# Patient Record
Sex: Male | Born: 1950 | Race: White | Hispanic: No | Marital: Married | Smoking: Never smoker
Health system: Southern US, Community
[De-identification: ages and names within clinical notes are randomized; demographics above are authoritative.]

## PROBLEM LIST (undated history)

## (undated) DIAGNOSIS — T63001A Toxic effect of unspecified snake venom, accidental (unintentional), initial encounter: Secondary | ICD-10-CM

## (undated) DIAGNOSIS — T7840XA Allergy, unspecified, initial encounter: Secondary | ICD-10-CM

## (undated) DIAGNOSIS — I10 Essential (primary) hypertension: Secondary | ICD-10-CM

## (undated) DIAGNOSIS — E785 Hyperlipidemia, unspecified: Secondary | ICD-10-CM

## (undated) DIAGNOSIS — K5792 Diverticulitis of intestine, part unspecified, without perforation or abscess without bleeding: Secondary | ICD-10-CM

## (undated) DIAGNOSIS — M1990 Unspecified osteoarthritis, unspecified site: Secondary | ICD-10-CM

## (undated) DIAGNOSIS — E079 Disorder of thyroid, unspecified: Secondary | ICD-10-CM

## (undated) DIAGNOSIS — Z800 Family history of malignant neoplasm of digestive organs: Secondary | ICD-10-CM

## (undated) HISTORY — DX: Hyperlipidemia, unspecified: E78.5

## (undated) HISTORY — DX: Family history of malignant neoplasm of digestive organs: Z80.0

## (undated) HISTORY — DX: Essential (primary) hypertension: I10

## (undated) HISTORY — DX: Unspecified osteoarthritis, unspecified site: M19.90

## (undated) HISTORY — DX: Allergy, unspecified, initial encounter: T78.40XA

## (undated) HISTORY — DX: Disorder of thyroid, unspecified: E07.9

## (undated) HISTORY — DX: Diverticulitis of intestine, part unspecified, without perforation or abscess without bleeding: K57.92

## (undated) HISTORY — DX: Toxic effect of unspecified snake venom, accidental (unintentional), initial encounter: T63.001A

---

## 1993-11-25 HISTORY — PX: APPENDECTOMY: SHX54

## 2004-05-22 ENCOUNTER — Ambulatory Visit (HOSPITAL_COMMUNITY): Admission: RE | Admit: 2004-05-22 | Discharge: 2004-05-22 | Payer: Self-pay | Admitting: Gastroenterology

## 2008-11-25 HISTORY — PX: CHOLECYSTECTOMY: SHX55

## 2009-08-13 ENCOUNTER — Inpatient Hospital Stay (HOSPITAL_COMMUNITY): Admission: EM | Admit: 2009-08-13 | Discharge: 2009-08-19 | Payer: Self-pay | Admitting: Emergency Medicine

## 2009-09-05 ENCOUNTER — Encounter: Payer: Self-pay | Admitting: Emergency Medicine

## 2009-09-05 ENCOUNTER — Ambulatory Visit: Payer: Self-pay | Admitting: Diagnostic Radiology

## 2009-09-05 ENCOUNTER — Inpatient Hospital Stay (HOSPITAL_COMMUNITY): Admission: EM | Admit: 2009-09-05 | Discharge: 2009-09-11 | Payer: Self-pay | Admitting: Internal Medicine

## 2009-09-09 ENCOUNTER — Encounter (INDEPENDENT_AMBULATORY_CARE_PROVIDER_SITE_OTHER): Payer: Self-pay | Admitting: General Surgery

## 2010-04-17 DIAGNOSIS — Z6830 Body mass index (BMI) 30.0-30.9, adult: Secondary | ICD-10-CM | POA: Insufficient documentation

## 2010-07-11 DIAGNOSIS — J309 Allergic rhinitis, unspecified: Secondary | ICD-10-CM | POA: Insufficient documentation

## 2011-01-28 DIAGNOSIS — I10 Essential (primary) hypertension: Secondary | ICD-10-CM | POA: Insufficient documentation

## 2011-01-28 DIAGNOSIS — M19049 Primary osteoarthritis, unspecified hand: Secondary | ICD-10-CM | POA: Insufficient documentation

## 2011-01-28 DIAGNOSIS — E039 Hypothyroidism, unspecified: Secondary | ICD-10-CM | POA: Insufficient documentation

## 2011-01-28 DIAGNOSIS — E782 Mixed hyperlipidemia: Secondary | ICD-10-CM | POA: Insufficient documentation

## 2011-02-28 LAB — DIFFERENTIAL
Basophils Absolute: 0 10*3/uL (ref 0.0–0.1)
Basophils Absolute: 0 10*3/uL (ref 0.0–0.1)
Basophils Absolute: 0 10*3/uL (ref 0.0–0.1)
Basophils Absolute: 0 10*3/uL (ref 0.0–0.1)
Basophils Relative: 0 % (ref 0–1)
Basophils Relative: 0 % (ref 0–1)
Basophils Relative: 0 % (ref 0–1)
Basophils Relative: 1 % (ref 0–1)
Eosinophils Absolute: 0 10*3/uL (ref 0.0–0.7)
Eosinophils Absolute: 0.2 10*3/uL (ref 0.0–0.7)
Eosinophils Absolute: 0.3 10*3/uL (ref 0.0–0.7)
Eosinophils Absolute: 0 10*3/uL (ref 0.0–0.7)
Eosinophils Absolute: 0 10*3/uL (ref 0.0–0.7)
Eosinophils Absolute: 0.1 10*3/uL (ref 0.0–0.7)
Eosinophils Relative: 0 % (ref 0–5)
Eosinophils Relative: 3 % (ref 0–5)
Eosinophils Relative: 0 % (ref 0–5)
Eosinophils Relative: 1 % (ref 0–5)
Lymphocytes Relative: 15 % (ref 12–46)
Lymphocytes Relative: 17 % (ref 12–46)
Lymphs Abs: 2 10*3/uL (ref 0.7–4.0)
Lymphs Abs: 1.5 10*3/uL (ref 0.7–4.0)
Lymphs Abs: 1.6 10*3/uL (ref 0.7–4.0)
Monocytes Absolute: 0.7 10*3/uL (ref 0.1–1.0)
Monocytes Absolute: 0.8 10*3/uL (ref 0.1–1.0)
Monocytes Absolute: 0.9 10*3/uL (ref 0.1–1.0)
Monocytes Absolute: 1.1 10*3/uL — ABNORMAL HIGH (ref 0.1–1.0)
Monocytes Relative: 6 % (ref 3–12)
Monocytes Relative: 7 % (ref 3–12)
Monocytes Relative: 7 % (ref 3–12)
Monocytes Relative: 8 % (ref 3–12)
Neutro Abs: 8.6 10*3/uL — ABNORMAL HIGH (ref 1.7–7.7)
Neutro Abs: 9.1 10*3/uL — ABNORMAL HIGH (ref 1.7–7.7)
Neutro Abs: 11 10*3/uL — ABNORMAL HIGH (ref 1.7–7.7)
Neutro Abs: 13.7 10*3/uL — ABNORMAL HIGH (ref 1.7–7.7)
Neutrophils Relative %: 75 % (ref 43–77)
Neutrophils Relative %: 76 % (ref 43–77)
Neutrophils Relative %: 80 % — ABNORMAL HIGH (ref 43–77)
Neutrophils Relative %: 80 % — ABNORMAL HIGH (ref 43–77)
Neutrophils Relative %: 81 % — ABNORMAL HIGH (ref 43–77)

## 2011-02-28 LAB — COMPREHENSIVE METABOLIC PANEL
ALT: 30 U/L (ref 0–53)
ALT: 16 U/L (ref 0–53)
ALT: 18 U/L (ref 0–53)
ALT: 24 U/L (ref 0–53)
AST: 15 U/L (ref 0–37)
AST: 26 U/L (ref 0–37)
AST: 15 U/L (ref 0–37)
AST: 22 U/L (ref 0–37)
AST: 44 U/L — ABNORMAL HIGH (ref 0–37)
Albumin: 2.6 g/dL — ABNORMAL LOW (ref 3.5–5.2)
Albumin: 3.1 g/dL — ABNORMAL LOW (ref 3.5–5.2)
Albumin: 4.5 g/dL (ref 3.5–5.2)
Alkaline Phosphatase: 58 U/L (ref 39–117)
Alkaline Phosphatase: 58 U/L (ref 39–117)
BUN: 9 mg/dL (ref 6–23)
BUN: 14 mg/dL (ref 6–23)
BUN: 18 mg/dL (ref 6–23)
CO2: 21 mEq/L (ref 19–32)
CO2: 26 mEq/L (ref 19–32)
CO2: 22 mEq/L (ref 19–32)
CO2: 24 mEq/L (ref 19–32)
Calcium: 8.1 mg/dL — ABNORMAL LOW (ref 8.4–10.5)
Calcium: 8.1 mg/dL — ABNORMAL LOW (ref 8.4–10.5)
Calcium: 8.2 mg/dL — ABNORMAL LOW (ref 8.4–10.5)
Calcium: 9.2 mg/dL (ref 8.4–10.5)
Chloride: 105 mEq/L (ref 96–112)
Chloride: 104 mEq/L (ref 96–112)
Creatinine, Ser: 0.78 mg/dL (ref 0.4–1.5)
Creatinine, Ser: 0.84 mg/dL (ref 0.4–1.5)
Creatinine, Ser: 1.2 mg/dL (ref 0.4–1.5)
GFR calc Af Amer: >60 mL/min (ref >60)
GFR calc Af Amer: >60 mL/min (ref >60)
GFR calc Af Amer: >60 mL/min (ref >60)
GFR calc Af Amer: >60 mL/min (ref >60)
GFR calc Af Amer: >60 mL/min (ref >60)
GFR calc non Af Amer: >60 mL/min (ref >60)
GFR calc non Af Amer: >60 mL/min (ref >60)
GFR calc non Af Amer: >60 mL/min (ref >60)
Glucose, Bld: 112 mg/dL — ABNORMAL HIGH (ref 70–99)
Glucose, Bld: 146 mg/dL — ABNORMAL HIGH (ref 70–99)
Glucose, Bld: 82 mg/dL (ref 70–99)
Glucose, Bld: 109 mg/dL — ABNORMAL HIGH (ref 70–99)
Glucose, Bld: 88 mg/dL (ref 70–99)
Potassium: 3.7 mEq/L (ref 3.5–5.1)
Potassium: 3.8 mEq/L (ref 3.5–5.1)
Potassium: 3.8 mEq/L (ref 3.5–5.1)
Potassium: 3.9 mEq/L (ref 3.5–5.1)
Sodium: 132 mEq/L — ABNORMAL LOW (ref 135–145)
Sodium: 136 mEq/L (ref 135–145)
Sodium: 131 mEq/L — ABNORMAL LOW (ref 135–145)
Total Bilirubin: 1.2 mg/dL (ref 0.3–1.2)
Total Bilirubin: 1.2 mg/dL (ref 0.3–1.2)
Total Bilirubin: 1.6 mg/dL — ABNORMAL HIGH (ref 0.3–1.2)
Total Protein: 5.9 g/dL — ABNORMAL LOW (ref 6.0–8.3)
Total Protein: 6 g/dL (ref 6.0–8.3)

## 2011-02-28 LAB — CBC
HCT: 37.8 % — ABNORMAL LOW (ref 39.0–52.0)
HCT: 33.9 % — ABNORMAL LOW (ref 39.0–52.0)
HCT: 40.3 % (ref 39.0–52.0)
Hemoglobin: 12.2 g/dL — ABNORMAL LOW (ref 13.0–17.0)
Hemoglobin: 12.8 g/dL — ABNORMAL LOW (ref 13.0–17.0)
Hemoglobin: 12.1 g/dL — ABNORMAL LOW (ref 13.0–17.0)
MCV: 88.4 fL (ref 78.0–100.0)
MCV: 88.7 fL (ref 78.0–100.0)
MCV: 87.9 fL (ref 78.0–100.0)
MCV: 89.2 fL (ref 78.0–100.0)
Platelets: 211 10*3/uL (ref 150–400)
Platelets: 187 10*3/uL (ref 150–400)
RBC: 4.08 MIL/uL — ABNORMAL LOW (ref 4.22–5.81)
RBC: 4.28 MIL/uL (ref 4.22–5.81)
RBC: 3.86 MIL/uL — ABNORMAL LOW (ref 4.22–5.81)
RBC: 4.03 MIL/uL — ABNORMAL LOW (ref 4.22–5.81)
RBC: 4.11 MIL/uL — ABNORMAL LOW (ref 4.22–5.81)
RDW: 12.2 % (ref 11.5–15.5)
RDW: 11.8 % (ref 11.5–15.5)
RDW: 12 % (ref 11.5–15.5)
WBC: 12.1 10*3/uL — ABNORMAL HIGH (ref 4.0–10.5)
WBC: 13.6 10*3/uL — ABNORMAL HIGH (ref 4.0–10.5)

## 2011-02-28 LAB — MAGNESIUM: Magnesium: 2.1 mg/dL (ref 1.5–2.5)

## 2011-02-28 LAB — POCT CARDIAC MARKERS
Myoglobin, poc: 63.8 ng/mL (ref 12–200)
Troponin i, poc: <0.05 ng/mL (ref 0.00–0.09)

## 2011-02-28 LAB — LIPID PANEL
Total CHOL/HDL Ratio: 3.2 RATIO
Triglycerides: 90 mg/dL (ref <150)

## 2011-02-28 LAB — URINALYSIS, ROUTINE W REFLEX MICROSCOPIC
Glucose, UA: NEGATIVE mg/dL
Hgb urine dipstick: NEGATIVE
Protein, ur: NEGATIVE mg/dL
Specific Gravity, Urine: 1.014 (ref 1.005–1.030)
Urobilinogen, UA: 1 mg/dL (ref 0.0–1.0)

## 2011-02-28 LAB — HEPATIC FUNCTION PANEL
ALT: 17 U/L (ref 0–53)
Alkaline Phosphatase: 56 U/L (ref 39–117)
Bilirubin, Direct: 0.2 mg/dL (ref 0.0–0.3)
Indirect Bilirubin: 0.7 mg/dL (ref 0.3–0.9)
Total Bilirubin: 0.9 mg/dL (ref 0.3–1.2)
Total Protein: 6 g/dL (ref 6.0–8.3)

## 2011-02-28 LAB — CULTURE, BLOOD (ROUTINE X 2)
Culture: NO GROWTH
Culture: NO GROWTH

## 2011-02-28 LAB — VANCOMYCIN, TROUGH: Vancomycin Tr: 11.2 ug/mL (ref 10.0–20.0)

## 2011-03-01 LAB — DIC (DISSEMINATED INTRAVASCULAR COAGULATION)PANEL
D-Dimer, Quant: 0.25 ug/mL-FEU (ref 0.00–0.48)
Fibrinogen: 293 mg/dL (ref 204–475)
Platelets: 184 10*3/uL (ref 150–400)
aPTT: 30 seconds (ref 24–37)

## 2011-03-01 LAB — COMPREHENSIVE METABOLIC PANEL
ALT: 20 U/L (ref 0–53)
AST: 26 U/L (ref 0–37)
Albumin: 3.7 g/dL (ref 3.5–5.2)
Calcium: 8.3 mg/dL — ABNORMAL LOW (ref 8.4–10.5)
GFR calc Af Amer: >60 mL/min (ref >60)
Glucose, Bld: 179 mg/dL — ABNORMAL HIGH (ref 70–99)
Potassium: 3.5 mEq/L (ref 3.5–5.1)
Sodium: 139 mEq/L (ref 135–145)
Total Protein: 6.5 g/dL (ref 6.0–8.3)

## 2011-03-01 LAB — CBC
HCT: 42.5 % (ref 39.0–52.0)
HCT: 43.7 % (ref 39.0–52.0)
HCT: 44 % (ref 39.0–52.0)
Hemoglobin: 14.1 g/dL (ref 13.0–17.0)
Hemoglobin: 14.4 g/dL (ref 13.0–17.0)
MCHC: 33.4 g/dL (ref 30.0–36.0)
MCHC: 33.9 g/dL (ref 30.0–36.0)
MCV: 88.1 fL (ref 78.0–100.0)
Platelets: 191 10*3/uL (ref 150–400)
Platelets: 194 10*3/uL (ref 150–400)
Platelets: 218 10*3/uL (ref 150–400)
RBC: 4.56 MIL/uL (ref 4.22–5.81)
RBC: 4.65 MIL/uL (ref 4.22–5.81)
RBC: 5.18 MIL/uL (ref 4.22–5.81)
RDW: 12.1 % (ref 11.5–15.5)
RDW: 12.4 % (ref 11.5–15.5)
RDW: 12.8 % (ref 11.5–15.5)
WBC: 12.4 10*3/uL — ABNORMAL HIGH (ref 4.0–10.5)
WBC: 8.5 10*3/uL (ref 4.0–10.5)

## 2011-03-01 LAB — DIFFERENTIAL
Lymphocytes Relative: 41 % (ref 12–46)
Lymphs Abs: 5.1 10*3/uL — ABNORMAL HIGH (ref 0.7–4.0)
Monocytes Relative: 5 % (ref 3–12)
Neutro Abs: 6.4 10*3/uL (ref 1.7–7.7)
Neutrophils Relative %: 51 % (ref 43–77)

## 2011-03-01 LAB — BASIC METABOLIC PANEL
BUN: 10 mg/dL (ref 6–23)
BUN: 12 mg/dL (ref 6–23)
CO2: 24 mEq/L (ref 19–32)
CO2: 26 mEq/L (ref 19–32)
Calcium: 8.3 mg/dL — ABNORMAL LOW (ref 8.4–10.5)
Calcium: 8.7 mg/dL (ref 8.4–10.5)
Calcium: 8.7 mg/dL (ref 8.4–10.5)
Chloride: 104 mEq/L (ref 96–112)
Creatinine, Ser: 0.85 mg/dL (ref 0.4–1.5)
Creatinine, Ser: 0.87 mg/dL (ref 0.4–1.5)
GFR calc Af Amer: >60 mL/min (ref >60)
GFR calc Af Amer: >60 mL/min (ref >60)
GFR calc non Af Amer: >60 mL/min (ref >60)
GFR calc non Af Amer: >60 mL/min (ref >60)
GFR calc non Af Amer: >60 mL/min (ref >60)
GFR calc non Af Amer: >60 mL/min (ref >60)
Glucose, Bld: 112 mg/dL — ABNORMAL HIGH (ref 70–99)
Glucose, Bld: 113 mg/dL — ABNORMAL HIGH (ref 70–99)
Glucose, Bld: 113 mg/dL — ABNORMAL HIGH (ref 70–99)
Potassium: 4.2 mEq/L (ref 3.5–5.1)
Potassium: 4.3 mEq/L (ref 3.5–5.1)
Potassium: 4.4 mEq/L (ref 3.5–5.1)
Sodium: 137 mEq/L (ref 135–145)
Sodium: 138 mEq/L (ref 135–145)

## 2011-03-01 LAB — PROTIME-INR
INR: 0.9 (ref 0.00–1.49)
Prothrombin Time: 12.3 seconds (ref 11.6–15.2)

## 2011-03-01 LAB — FIBRINOGEN: Fibrinogen: 304 mg/dL (ref 204–475)

## 2011-04-12 NOTE — Op Note (Signed)
NAMESOLOMAN, MCKEITHAN                           ACCOUNT NO.:  [id number]   MEDICAL RECORD NO.:  [id number]                   PATIENT TYPE:  AMB   LOCATION:  ENDO                                 FACILITY:  [facility]   PHYSICIAN:  Graylin Shiver, M.D.                DATE OF BIRTH:  1951/01/03   DATE OF PROCEDURE:  05/22/2004  DATE OF DISCHARGE:                                 OPERATIVE REPORT   PROCEDURE:  Colonoscopy.   INDICATION:  Screening.   Informed consent was obtained after explanation of the risks of bleeding,  infection, or perforation.   PREMEDICATIONS:  Fentanyl 100 mcg IV, Versed 10 mg IV.   DESCRIPTION OF PROCEDURE:  With the patient in the left lateral decubitus  position a rectal exam was performed and no masses were felt.  The Olympus  colonoscope was inserted into the rectum and advanced around the colon to  the cecum.  Cecal landmarks were identified.  The cecum and ascending colon  were normal.  The transverse colon normal.  The descending colon and sigmoid  showed a few scattered diverticula.  The rectum was normal.  He tolerated  the procedure well without complications.   IMPRESSION:  Minimal diverticulosis of the left colon, otherwise normal  colonoscopy to the cecum.   PLAN:  I would recommend a follow-up colonoscopy again in 10 years.                                               Graylin Shiver, M.D.    Germain Osgood  D:  05/22/2004  T:  05/22/2004  Job:  [id number]   cc:   Duwayne Heck L. Mahaffey, M.D.  [address]  [place]  [place] [address]  Fax: [phone]

## 2011-05-07 DIAGNOSIS — R7301 Impaired fasting glucose: Secondary | ICD-10-CM | POA: Insufficient documentation

## 2012-05-30 ENCOUNTER — Emergency Department (HOSPITAL_COMMUNITY)
Admission: EM | Admit: 2012-05-30 | Discharge: 2012-05-30 | Disposition: A | Discharge status: Home / Self Care | Payer: 59 | Attending: Emergency Medicine | Admitting: Emergency Medicine

## 2012-05-30 ENCOUNTER — Emergency Department (HOSPITAL_COMMUNITY): Payer: 59

## 2012-05-30 DIAGNOSIS — N201 Calculus of ureter: Secondary | ICD-10-CM | POA: Insufficient documentation

## 2012-05-30 DIAGNOSIS — N23 Unspecified renal colic: Secondary | ICD-10-CM

## 2012-05-30 DIAGNOSIS — R109 Unspecified abdominal pain: Secondary | ICD-10-CM | POA: Insufficient documentation

## 2012-05-30 DIAGNOSIS — N1330 Unspecified hydronephrosis: Secondary | ICD-10-CM | POA: Insufficient documentation

## 2012-05-30 LAB — URINALYSIS, ROUTINE W REFLEX MICROSCOPIC
Bilirubin Urine: NEGATIVE
Glucose, UA: NEGATIVE mg/dL
Leukocytes, UA: NEGATIVE
Nitrite: NEGATIVE
Protein, ur: NEGATIVE mg/dL
Specific Gravity, Urine: 1.031 — ABNORMAL HIGH (ref 1.005–1.030)
Urobilinogen, UA: 1 mg/dL (ref 0.0–1.0)
pH: 5.5 (ref 5.0–8.0)

## 2012-05-30 LAB — URINE MICROSCOPIC-ADD ON

## 2012-05-30 MED ORDER — HYDROMORPHONE HCL PF 1 MG/ML IJ SOLN
1.0000 mg | Freq: Once | INTRAMUSCULAR | Status: AC
  Administered 2012-05-30: 1 mg via INTRAVENOUS
  Filled 2012-05-30: qty 1

## 2012-05-30 MED ORDER — TAMSULOSIN HCL 0.4 MG PO CAPS: 0.4000 mg | ORAL_CAPSULE | Freq: Every day | ORAL | Status: DC

## 2012-05-30 MED ORDER — KETOROLAC TROMETHAMINE 30 MG/ML IJ SOLN
INTRAMUSCULAR | Status: AC
  Administered 2012-05-30: 30 mg
  Filled 2012-05-30: qty 1

## 2012-05-30 MED ORDER — ONDANSETRON 8 MG PO TBDP: 8.0000 mg | ORAL_TABLET | Freq: Three times a day (TID) | ORAL | Status: AC | PRN

## 2012-05-30 MED ORDER — OXYCODONE-ACETAMINOPHEN 7.5-325 MG PO TABS: 1.0000 | ORAL_TABLET | ORAL | Status: AC | PRN

## 2012-05-30 MED ORDER — ONDANSETRON 8 MG PO TBDP
ORAL_TABLET | ORAL | Status: AC
  Administered 2012-05-30: 13:00:00
  Filled 2012-05-30: qty 1

## 2012-05-30 MED ORDER — HYDROMORPHONE HCL PF 1 MG/ML IJ SOLN
INTRAMUSCULAR | Status: AC
  Administered 2012-05-30: 14:00:00
  Filled 2012-05-30: qty 1

## 2012-05-30 NOTE — ED Notes (Signed)
Pt reports having left flank pain starting around 30 minutes prior to coming to the ER. Pt actively nauseated and vomiting upon arrival. Pt alert and oriented x 4. Wife at the bedside.

## 2012-05-30 NOTE — ED Notes (Signed)
WUJ:[id number]<JY> Expected date:05/30/12<BR> Expected time:12:39 PM<BR> Means of arrival:Car<BR> Comments:<BR> ? Kidney Stone

## 2012-05-30 NOTE — ED Provider Notes (Signed)
History     CSN: [id number]  Arrival date & time 05/30/12  1254   First MD Initiated Contact with Patient 05/30/12 1302      Chief Complaint  Patient presents with  . Flank Pain    (Consider location/radiation/quality/duration/timing/severity/associated sxs/prior treatment) Patient is a 61 y.o. male presenting with flank pain. The history is provided by the patient.  Flank Pain   patient here with left-sided flank pain x23 days worse in the last 30 minutes. Pain has been colicky. No hematuria or dysuria. No fever no prior history of kidney stones. No medications taken prior to arrival. Some associated nausea and vomiting. Denies any testicular pain.  No past medical history on file.  No past surgical history on file.  No family history on file.  History  Substance Use Topics  . Smoking status: Not on file  . Smokeless tobacco: Not on file  . Alcohol Use: Not on file      Review of Systems  Genitourinary: Positive for flank pain.  All other systems reviewed and are negative.    Allergies  Review of patient's allergies indicates not on file.  Home Medications  No current outpatient prescriptions on file.  BP 166/84  Pulse 67  Temp 97.8 F (36.6 C) (Oral)  Resp 19  SpO2 99%  Physical Exam  Nursing note and vitals reviewed. Constitutional: He is oriented to person, place, and time. He appears well-developed and well-nourished.  Non-toxic appearance. No distress.  HENT:  Head: Normocephalic and atraumatic.  Eyes: Conjunctivae, EOM and lids are normal. Pupils are equal, round, and reactive to light.  Neck: Normal range of motion. Neck supple. No tracheal deviation present. No mass present.  Cardiovascular: Normal rate, regular rhythm and normal heart sounds.  Exam reveals no gallop.   No murmur heard. Pulmonary/Chest: Effort normal and breath sounds normal. No stridor. No respiratory distress. He has no decreased breath sounds. He has no wheezes. He has no  rhonchi. He has no rales.  Abdominal: Soft. Normal appearance and bowel sounds are normal. He exhibits no distension. There is no tenderness. There is no rebound and no CVA tenderness.  Musculoskeletal: Normal range of motion. He exhibits no edema and no tenderness.  Neurological: He is alert and oriented to person, place, and time. He has normal strength. No cranial nerve deficit or sensory deficit. GCS eye subscore is 4. GCS verbal subscore is 5. GCS motor subscore is 6.  Skin: Skin is warm and dry. No abrasion and no rash noted.  Psychiatric: He has a normal mood and affect. His speech is normal and behavior is normal.    ED Course  Procedures (including critical care time)   Labs Reviewed  URINALYSIS, ROUTINE W REFLEX MICROSCOPIC  URINE CULTURE   No results found.   No diagnosis found.    MDM  Patient given pain meds here for his kidney stone and he does feel better. Patient instructed to have an elective CAT scan of his chest for evaluation of the pleural thickening and he'll be given a referral to urology on call        Toy Baker, MD 05/30/12 1438

## 2012-05-31 LAB — URINE CULTURE
Colony Count: NO GROWTH
Culture: NO GROWTH

## 2012-06-11 ENCOUNTER — Other Ambulatory Visit: Payer: Self-pay | Admitting: Family Medicine

## 2012-06-11 ENCOUNTER — Ambulatory Visit
Admission: RE | Admit: 2012-06-11 | Discharge: 2012-06-11 | Disposition: A | Discharge status: Home / Self Care | Payer: 59 | Source: Ambulatory Visit | Attending: Family Medicine | Admitting: Family Medicine

## 2012-06-11 DIAGNOSIS — R9389 Abnormal findings on diagnostic imaging of other specified body structures: Secondary | ICD-10-CM

## 2013-03-01 ENCOUNTER — Emergency Department (HOSPITAL_COMMUNITY)
Admission: EM | Admit: 2013-03-01 | Discharge: 2013-03-01 | Disposition: A | Discharge status: Home / Self Care | Payer: 59 | Attending: Emergency Medicine | Admitting: Emergency Medicine

## 2013-03-01 DIAGNOSIS — Z79899 Other long term (current) drug therapy: Secondary | ICD-10-CM | POA: Insufficient documentation

## 2013-03-01 DIAGNOSIS — I10 Essential (primary) hypertension: Secondary | ICD-10-CM | POA: Insufficient documentation

## 2013-03-01 DIAGNOSIS — R209 Unspecified disturbances of skin sensation: Secondary | ICD-10-CM | POA: Insufficient documentation

## 2013-03-01 NOTE — ED Notes (Signed)
Pt states SOB of since changing his medication on March 24, and starting today numbness in left arm and lips.

## 2013-03-01 NOTE — ED Provider Notes (Signed)
History     CSN: [id number]  Arrival date & time 03/01/13  0344   First MD Initiated Contact with Patient 03/01/13 [phone]      Chief Complaint  Patient presents with  . Shortness of Breath    (Consider location/radiation/quality/duration/timing/severity/associated sxs/prior treatment) HPI Comments: Rodney Pineda is a 62 y.o. Male who was driving his truck, tonight, when he felt a tingling sensation in his left hand. He checked his blood pressure, while he was driving and again, after he gets that, and it was elevated both times. He did not have headache or chest pain. His doctor added hydrochlorothiazide to his lisinopril, 2 weeks ago because his blood pressure was over 140. Since that time. He has been feeling weak, being more tired than usual. His wife, who is a Engineer, civil (consulting), has been checking his blood pressure, and it has been below 120 systolic. He did not take his antihypertensive medication on 02/27/13 or,  02/28/2013 He has been eating well. Since arriving in the emergency room he feels better. There is less tingling in his left hand. He has not developed any other symptoms. There are no known modifying factors.  Patient is a 62 y.o. male presenting with shortness of breath. The history is provided by the patient.  Shortness of Breath   No past medical history on file.  No past surgical history on file.  No family history on file.  History  Substance Use Topics  . Smoking status: Not on file  . Smokeless tobacco: Not on file  . Alcohol Use: Not on file      Review of Systems  Respiratory: Positive for shortness of breath.   All other systems reviewed and are negative.    Allergies  Review of patient's allergies indicates no known allergies.  Home Medications   Current Outpatient Rx  Name  Route  Sig  Dispense  Refill  . fish oil-omega-3 fatty acids 1000 MG capsule   Oral   Take 1 g by mouth 2 (two) times daily.         . Flaxseed, Linseed, 1000 MG CAPS   Oral  Take 1,000 mg by mouth 2 (two) times daily.         Marland Kitchen levothyroxine (SYNTHROID, LEVOTHROID) 137 MCG tablet   Oral   Take 137 mcg by mouth daily.         Marland Kitchen lisinopril-hydrochlorothiazide (PRINZIDE,ZESTORETIC) 20-12.5 MG per tablet   Oral   Take 1 tablet by mouth daily.         . Multiple Vitamin (MULTIVITAMIN WITH MINERALS) TABS   Oral   Take 1 tablet by mouth daily.         . simvastatin (ZOCOR) 40 MG tablet   Oral   Take 20 mg by mouth every evening. Pt takes 0.5 of 40 mg tablet           BP 149/76  Pulse 80  Temp(Src) 98.5 F (36.9 C) (Oral)  Resp 16  SpO2 100%  Physical Exam  Nursing note and vitals reviewed. Constitutional: He is oriented to person, place, and time. He appears well-developed and well-nourished.  HENT:  Head: Normocephalic and atraumatic.  Right Ear: External ear normal.  Left Ear: External ear normal.  Eyes: Conjunctivae and EOM are normal. Pupils are equal, round, and reactive to light.  Neck: Normal range of motion and phonation normal. Neck supple.  Cardiovascular: Normal rate, regular rhythm, normal heart sounds and intact distal pulses.   Pulmonary/Chest: Effort normal and  breath sounds normal. He exhibits no bony tenderness.  Abdominal: Soft. Normal appearance. There is no tenderness.  Musculoskeletal: Normal range of motion.  Neurological: He is alert and oriented to person, place, and time. He has normal strength. No cranial nerve deficit or sensory deficit. He exhibits normal muscle tone. Coordination normal.  Romberg is negative. There is no dysesthesia of the extremities.  Skin: Skin is warm, dry and intact.  Psychiatric: He has a normal mood and affect. His behavior is normal. Judgment and thought content normal.    ED Course  Procedures (including critical care time)     Date: 09/11/2012  Rate: 86  Rhythm: normal sinus rhythm  QRS Axis: normal  PR and QT Intervals: normal  ST/T Wave abnormalities: normal  PR and QRS  Conduction Disutrbances:none  Narrative Interpretation:   Old EKG Reviewed: none available   Nursing Notes Reviewed/ Care Coordinated, and agree without changes. Applicable Imaging Reviewed.  Interpretation of Laboratory Data incorporated into ED treatment     1. Hypertension       MDM  Episode of hypertension, measured by patient. No recurrence of hypertension in the emergency department. Symptoms improved spontaneously. I doubt hypertensive urgency, ACS, metabolic instability. He is stable for discharge      Plan: Home Medications- he will contact his PCP, before resuming his prescribed medication; Home Treatments- rest; Recommended follow up- PCP for discussion of appropriate blood pressure medications     Flint Melter, MD 03/01/13 [phone]

## 2014-01-31 DIAGNOSIS — Z800 Family history of malignant neoplasm of digestive organs: Secondary | ICD-10-CM | POA: Insufficient documentation

## 2015-05-01 DIAGNOSIS — K635 Polyp of colon: Secondary | ICD-10-CM | POA: Insufficient documentation

## 2016-03-14 LAB — HM HEPATITIS C SCREENING LAB: HM Hepatitis Screen: NEGATIVE

## 2017-03-12 DIAGNOSIS — C44619 Basal cell carcinoma of skin of left upper limb, including shoulder: Secondary | ICD-10-CM | POA: Insufficient documentation

## 2017-10-31 ENCOUNTER — Other Ambulatory Visit: Payer: Self-pay | Admitting: *Deleted

## 2017-10-31 ENCOUNTER — Encounter: Payer: Self-pay | Admitting: *Deleted

## 2017-11-03 ENCOUNTER — Ambulatory Visit: Payer: Self-pay | Admitting: Family Medicine

## 2017-11-28 ENCOUNTER — Ambulatory Visit (INDEPENDENT_AMBULATORY_CARE_PROVIDER_SITE_OTHER): Payer: PRIVATE HEALTH INSURANCE | Admitting: Family Medicine

## 2017-11-28 ENCOUNTER — Encounter: Payer: Self-pay | Admitting: Family Medicine

## 2017-11-28 VITALS — BP 140/82 | HR 64 | Temp 98.3°F | Ht 71.0 in | Wt 238.2 lb

## 2017-11-28 DIAGNOSIS — E782 Mixed hyperlipidemia: Secondary | ICD-10-CM | POA: Diagnosis not present

## 2017-11-28 DIAGNOSIS — E039 Hypothyroidism, unspecified: Secondary | ICD-10-CM | POA: Diagnosis not present

## 2017-11-28 DIAGNOSIS — I10 Essential (primary) hypertension: Secondary | ICD-10-CM

## 2017-11-28 MED ORDER — SIMVASTATIN 40 MG PO TABS: 40.0000 mg | ORAL_TABLET | Freq: Every evening | ORAL | 3 refills | Status: DC

## 2017-11-28 MED ORDER — METOPROLOL SUCCINATE ER 100 MG PO TB24: 100.0000 mg | ORAL_TABLET | Freq: Every day | ORAL | 3 refills | Status: DC

## 2017-11-28 MED ORDER — LISINOPRIL 40 MG PO TABS: 40.0000 mg | ORAL_TABLET | Freq: Every day | ORAL | 3 refills | Status: DC

## 2017-11-28 MED ORDER — LEVOTHYROXINE SODIUM 137 MCG PO TABS: 137.0000 ug | ORAL_TABLET | Freq: Every day | ORAL | 3 refills | Status: DC

## 2017-11-28 MED ORDER — AMLODIPINE BESYLATE 5 MG PO TABS: 5.0000 mg | ORAL_TABLET | Freq: Every day | ORAL | 3 refills | Status: DC

## 2017-11-28 NOTE — Patient Instructions (Signed)
It was so good seeing you again! Thank you for establishing with my new practice and allowing me to continue caring for you. It means a lot to me.   Please schedule a follow up appointment with me in 3-4 months for your complete physical, come fasting.  Try to follow a low sugar, low fat diet.

## 2017-11-28 NOTE — Progress Notes (Signed)
Subjective  CC:  Chief Complaint  Patient presents with  . Establish Care    Transfer from [place]  . Hypertension  . Hyperlipidemia  . Hypothyroidism    HPI: Rodney Pineda is a 67 y.o. male who presents to the office today to address the problems listed above in the chief complaint. He is a former [facility] patient and is here to reestablish care with me today.  Last cpe 03/12/2017. I reviewed stable labs from care everywhere.  Hypertension with white coat component f/u: Control is good . Pt reports he is doing well. taking medications as instructed, no medication side effects noted, home BP monitoring in range of 195-093O systolic over 67-12W diastolic, no TIAs, no chest pain on exertion, no dyspnea on exertion, no swelling of ankles. He denies adverse effects from his BP medications. Compliance with medication is good.  Thyroid f/u: Patient presents for evaluation of hypothyroidism. Current symptoms include denies fatigue, weight changes, heat/cold intolerance, bowel/skin changes or CVS symptoms.compliance with medications is good.  BP Readings from Last 3 Encounters:  11/28/17 140/82  03/01/13 149/76  05/30/12 145/98   Wt Readings from Last 3 Encounters:  11/28/17 238 lb 4 oz (108.1 kg)   I reviewed the patients updated PMH, FH, and SocHx.    Patient Active Problem List   Diagnosis Date Noted  . Basal cell carcinoma (BCC) of skin of left upper extremity including shoulder 03/12/2017  . Colon polyps 05/01/2015  . Family history of colon cancer 01/31/2014  . IFG (impaired fasting glucose) 05/07/2011  . Acquired hypothyroidism 01/28/2011  . Benign essential hypertension 01/28/2011  . Mixed hyperlipidemia 01/28/2011  . Osteoarthritis, 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  . Allergic rhinitis 07/11/2010  . BMI 30.0-30.9,adult 04/17/2010    Allergies: Hydrochlorothiazide and Niacin and related  Social History: Patient  reports that  has never smoked. he has never used smokeless tobacco. He reports  that he does not drink alcohol or use drugs.  Current Meds  Medication Sig  . amLODipine (NORVASC) 5 MG tablet Take 1 tablet (5 mg total) by mouth daily.  Marland Kitchen CINNAMON PO Take 1 tablet by mouth daily.   . diclofenac sodium (VOLTAREN) 1 % GEL Place 2 g onto the skin as needed.   Marland Kitchen esomeprazole (NEXIUM) 20 MG capsule Take 20 mg by mouth as needed.   . fish oil-omega-3 fatty acids 1000 MG capsule Take 1 g by mouth 2 (two) times daily.  . Flaxseed, Linseed, 1000 MG CAPS Take 1,000 mg by mouth 2 (two) times daily.  . furosemide (LASIX) 20 MG tablet Take 20 mg by mouth as needed.   Marland Kitchen levothyroxine (SYNTHROID, LEVOTHROID) 137 MCG tablet Take 1 tablet (137 mcg total) by mouth daily.  Marland Kitchen lisinopril (PRINIVIL,ZESTRIL) 40 MG tablet Take 1 tablet (40 mg total) by mouth daily.  . metoprolol succinate (TOPROL-XL) 100 MG 24 hr tablet Take 1 tablet (100 mg total) by mouth daily.  . Multiple Vitamin (MULTIVITAMIN WITH MINERALS) TABS Take 1 tablet by mouth daily.  . simvastatin (ZOCOR) 40 MG tablet Take 1 tablet (40 mg total) by mouth every evening.  . [DISCONTINUED] amLODipine (NORVASC) 10 MG tablet Take 5 mg by mouth daily.   . [DISCONTINUED] levothyroxine (SYNTHROID, LEVOTHROID) 137 MCG tablet Take 137 mcg by mouth daily.  . [DISCONTINUED] lisinopril (PRINIVIL,ZESTRIL) 40 MG tablet Take 40 mg by mouth daily.   . [DISCONTINUED] metoprolol succinate (TOPROL-XL) 100 MG 24 hr tablet Take 100 mg by mouth daily.   . [DISCONTINUED] simvastatin (ZOCOR) 40  MG tablet Take 40 mg by mouth every evening. Pt takes 0.5 of 40 mg tablet     Review of Systems: Cardiovascular: negative for chest pain, palpitations, leg swelling, orthopnea Respiratory: negative for SOB, wheezing or persistent cough Gastrointestinal: negative for abdominal pain Genitourinary: negative for dysuria or gross hematuria  Objective  Vitals: BP 140/82 (BP Location: Left Arm, Patient Position: Sitting, Cuff Size: Large)   Pulse 64   Temp 98.3 F  (36.8 C) (Oral)   Ht 5\' 11"  (1.803 m)   Wt 238 lb 4 oz (108.1 kg)   SpO2 97%   BMI 33.23 kg/m  General: no acute distress  Psych:  Alert and oriented, normal mood and affect HEENT:  Normocephalic, atraumatic, supple neck  Cardiovascular:  RRR with systolic murmur. no edema Respiratory:  Good breath sounds bilaterally, CTAB with normal respiratory effort Skin:  Warm, no rashes Neurologic:   Mental status is normal. No tremor  Assessment  1. Benign essential hypertension   2. Acquired hypothyroidism   3. Mixed hyperlipidemia      Plan    Hypertension f/u: BP control is fairly well controlled. Continue same meds and work on weight loss. Continue home readings due to white coat component. Due for labs in april  Hyperlipidemia f/u: stable. Refilled statin  Thyroid controlled well clinically. Refilled meds  Education regarding management of these chronic disease states was given. Management strategies discussed on successive visits include dietary and exercise recommendations, goals of achieving and maintaining IBW, and lifestyle modifications aiming for adequate sleep and minimizing stressors.   Follow up: No Follow-up on file.   Commons side effects, risks, benefits, and alternatives for medications and treatment plan prescribed today were discussed, and the patient expressed understanding of the given instructions. Patient is instructed to call or message via MyChart if he/she has any questions or concerns regarding our treatment plan. No barriers to understanding were identified. We discussed Red Flag symptoms and signs in detail. Patient expressed understanding regarding what to do in case of urgent or emergency type symptoms.   Medication list was reconciled, printed and provided to the patient in AVS. Patient instructions and summary information was reviewed with the patient as documented in the AVS. This note was prepared with assistance of Dragon voice recognition software.  Occasional wrong-word or sound-a-like substitutions may have occurred due to the inherent limitations of voice recognition software  No orders of the defined types were placed in this encounter.  Meds ordered this encounter  Medications  . amLODipine (NORVASC) 5 MG tablet    Sig: Take 1 tablet (5 mg total) by mouth daily.    Dispense:  90 tablet    Refill:  3  . levothyroxine (SYNTHROID, LEVOTHROID) 137 MCG tablet    Sig: Take 1 tablet (137 mcg total) by mouth daily.    Dispense:  90 tablet    Refill:  3  . lisinopril (PRINIVIL,ZESTRIL) 40 MG tablet    Sig: Take 1 tablet (40 mg total) by mouth daily.    Dispense:  90 tablet    Refill:  3  . metoprolol succinate (TOPROL-XL) 100 MG 24 hr tablet    Sig: Take 1 tablet (100 mg total) by mouth daily.    Dispense:  90 tablet    Refill:  3  . simvastatin (ZOCOR) 40 MG tablet    Sig: Take 1 tablet (40 mg total) by mouth every evening.    Dispense:  90 tablet    Refill:  3

## 2018-02-12 ENCOUNTER — Ambulatory Visit (INDEPENDENT_AMBULATORY_CARE_PROVIDER_SITE_OTHER): Payer: PRIVATE HEALTH INSURANCE

## 2018-02-12 ENCOUNTER — Other Ambulatory Visit: Payer: Self-pay

## 2018-02-12 ENCOUNTER — Encounter: Payer: Self-pay | Admitting: Family Medicine

## 2018-02-12 ENCOUNTER — Ambulatory Visit: Payer: PRIVATE HEALTH INSURANCE | Admitting: Family Medicine

## 2018-02-12 VITALS — BP 142/92 | HR 64 | Temp 98.6°F | Resp 17 | Ht 71.0 in | Wt 239.6 lb

## 2018-02-12 DIAGNOSIS — R3915 Urgency of urination: Secondary | ICD-10-CM

## 2018-02-12 DIAGNOSIS — R1031 Right lower quadrant pain: Secondary | ICD-10-CM | POA: Diagnosis not present

## 2018-02-12 LAB — CBC WITH DIFFERENTIAL/PLATELET
BASOS PCT: 0.6 % (ref 0.0–3.0)
Basophils Absolute: 0 10*3/uL (ref 0.0–0.1)
EOS ABS: 0.1 10*3/uL (ref 0.0–0.7)
Eosinophils Relative: 1.7 % (ref 0.0–5.0)
HEMATOCRIT: 44.8 % (ref 39.0–52.0)
Hemoglobin: 15.1 g/dL (ref 13.0–17.0)
LYMPHS PCT: 39.7 % (ref 12.0–46.0)
Lymphs Abs: 2.7 10*3/uL (ref 0.7–4.0)
MCHC: 33.7 g/dL (ref 30.0–36.0)
MCV: 86.9 fl (ref 78.0–100.0)
Monocytes Absolute: 0.6 10*3/uL (ref 0.1–1.0)
Monocytes Relative: 8.2 % (ref 3.0–12.0)
NEUTROS ABS: 3.4 10*3/uL (ref 1.4–7.7)
Neutrophils Relative %: 49.8 % (ref 43.0–77.0)
PLATELETS: 204 10*3/uL (ref 150.0–400.0)
RBC: 5.15 Mil/uL (ref 4.22–5.81)
RDW: 12.7 % (ref 11.5–15.5)
WBC: 6.9 10*3/uL (ref 4.0–10.5)

## 2018-02-12 LAB — POCT URINALYSIS DIPSTICK
BILIRUBIN UA: NEGATIVE
Blood, UA: NEGATIVE
GLUCOSE UA: NEGATIVE
Ketones, UA: NEGATIVE
Leukocytes, UA: NEGATIVE
Nitrite, UA: NEGATIVE
Protein, UA: NEGATIVE
Spec Grav, UA: 1.015 (ref 1.010–1.025)
Urobilinogen, UA: 0.2 E.U./dL
pH, UA: 6 (ref 5.0–8.0)

## 2018-02-12 NOTE — Patient Instructions (Signed)
Please follow up if symptoms do not improve or as needed.   I will release your lab results to you on your MyChart account with further instructions. Please reply with any questions.   Please go to our [facility] office to get your xrays done. You can walk in M-F between 8am and 5pm. Tell them you are there for xrays ordered by me. They will send me the results, then I will let you know the results with instructions.   Address: [place], [place], [place]  (office sits at [address] at [address] intersection; from here, turn left onto [address]), take to [address], turn right and go for a mile or so, office will be on left across form [employer] )

## 2018-02-12 NOTE — Progress Notes (Signed)
Subjective  CC:  Chief Complaint  Patient presents with  . Abdominal Pain    Lower right abdominal pain radiating toward mid back area, right testicle tender also, on both side but worse on the right, been going on for about 2 months    HPI: Rodney Pineda is a 67 y.o. male who presents to the office today to address the problems listed above in the chief complaint.  67 yo with remote h/o renal stones presents with vague RLQ pain - variable and intermittent and mild. Present daily for last 4-6 weeks. At times, right testicle feels sore. Pain moves right low back to right lower quadrant. No rash. Not associated with movement or position changes. No urinary sxs or gross hematuria. Hasn't noted and testicular tenderness, lumps or bulges. No constipation, n/v or change in appetite or BMs. No trauma. Used old flomax x 4 days - no significant change. hasn't needed any pain meds. Hasn't interfered with daily life at all.   I reviewed the patients updated PMH, FH, and SocHx.    Patient Active Problem List   Diagnosis Date Noted  . Basal cell carcinoma (BCC) of skin of left upper extremity including shoulder 03/12/2017  . Colon polyps 05/01/2015  . Family history of colon cancer 01/31/2014  . IFG (impaired fasting glucose) 05/07/2011  . Acquired hypothyroidism 01/28/2011  . Benign essential hypertension 01/28/2011  . Mixed hyperlipidemia 01/28/2011  . Osteoarthritis, 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  . Allergic rhinitis 07/11/2010  . BMI 30.0-30.9,adult 04/17/2010   Current Meds  Medication Sig  . amLODipine (NORVASC) 5 MG tablet Take 1 tablet (5 mg total) by mouth daily.  Marland Kitchen CINNAMON PO Take 1 tablet by mouth daily.   . diclofenac sodium (VOLTAREN) 1 % GEL Place 2 g onto the skin as needed.   Marland Kitchen esomeprazole (NEXIUM) 20 MG capsule Take 20 mg by mouth as needed.   . fish oil-omega-3 fatty acids 1000 MG capsule Take 1 g by mouth 2 (two) times daily.  . Flaxseed, Linseed, 1000 MG CAPS Take 1,000 mg by mouth 2  (two) times daily.  . furosemide (LASIX) 20 MG tablet Take 20 mg by mouth as needed.   Marland Kitchen GARLIC PO Take 1 tablet by mouth daily.  Marland Kitchen levothyroxine (SYNTHROID, LEVOTHROID) 137 MCG tablet Take 1 tablet (137 mcg total) by mouth daily.  Marland Kitchen lisinopril (PRINIVIL,ZESTRIL) 40 MG tablet Take 1 tablet (40 mg total) by mouth daily.  . metoprolol succinate (TOPROL-XL) 100 MG 24 hr tablet Take 1 tablet (100 mg total) by mouth daily.  . Multiple Vitamin (MULTIVITAMIN WITH MINERALS) TABS Take 1 tablet by mouth daily.  . simvastatin (ZOCOR) 40 MG tablet Take 1 tablet (40 mg total) by mouth every evening.  . TURMERIC PO Take 1 tablet by mouth daily.    Allergies: Patient is allergic to hydrochlorothiazide and niacin and related. Family History: Patient family history includes Cancer in his maternal grandmother; Colon cancer in his maternal aunt and mother; Hyperlipidemia in his father; Hypertension in his brother, father, sister, and son; Lung cancer in his maternal aunt. Social History:  Patient  reports that he has never smoked. He has never used smokeless tobacco. He reports that he does not drink alcohol or use drugs.  Review of Systems: Constitutional: Negative for fever malaise or anorexia Cardiovascular: negative for chest pain Respiratory: negative for SOB or persistent cough Gastrointestinal: negative for n/v/d  Objective  Vitals: BP (!) 142/92   Pulse 64   Temp 98.6 F (37 C) (  Oral)   Resp 17   Ht 5\' 11"  (1.803 m)   Wt 239 lb 9.6 oz (108.7 kg)   SpO2 98%   BMI 33.42 kg/m  General: no acute distress , A&Ox3, laughing. Appears comfortable HEENT: PEERL, conjunctiva normal, Oropharynx moist,neck is supple Cardiovascular:  RRR without murmur or gallop.  Respiratory:  Good breath sounds bilaterally, CTAB with normal respiratory effort Gastrointestinal: soft, flat abdomen, normal active bowel sounds, no palpable masses, no hepatosplenomegaly, no appreciated hernias GU: nl penis, no hernia,  nontender testes w/o mass  Skin:  Warm, no rashes Back: FROM w/o any change in pain or soreness  Office Visit on 02/12/2018  Component Date Value Ref Range Status  . Color, UA 02/12/2018 yellow   Final  . Clarity, UA 02/12/2018 clear   Final  . Glucose, UA 02/12/2018 negative   Final  . Bilirubin, UA 02/12/2018 negative   Final  . Ketones, UA 02/12/2018 negative   Final  . Spec Grav, UA 02/12/2018 1.015  1.010 - 1.025 Final  . Blood, UA 02/12/2018 negative   Final  . pH, UA 02/12/2018 6.0  5.0 - 8.0 Final  . Protein, UA 02/12/2018 negative   Final  . Urobilinogen, UA 02/12/2018 0.2  0.2 or 1.0 E.U./dL Final  . Nitrite, UA 02/12/2018 negative   Final  . Leukocytes, UA 02/12/2018 Negative  Negative Final    Assessment  1. Right lower quadrant abdominal pain   2. Urinary urgency      Plan   RLQ pain:  Unclear etiology. ? Renal stone, diverticular disease, constipation, or other. rec advil, labwork and xray. Monitor sxs. Return if worsens.  Follow up: prn    Commons side effects, risks, benefits, and alternatives for medications and treatment plan prescribed today were discussed, and the patient expressed understanding of the given instructions. Patient is instructed to call or message via MyChart if he/she has any questions or concerns regarding our treatment plan. No barriers to understanding were identified. We discussed Red Flag symptoms and signs in detail. Patient expressed understanding regarding what to do in case of urgent or emergency type symptoms.   Medication list was reconciled, printed and provided to the patient in AVS. Patient instructions and summary information was reviewed with the patient as documented in the AVS. This note was prepared with assistance of Dragon voice recognition software. Occasional wrong-word or sound-a-like substitutions may have occurred due to the inherent limitations of voice recognition software  Orders Placed This Encounter  Procedures    . DG Abd 1 View  . CBC with Differential/Platelet  . Comprehensive metabolic panel  . POCT urinalysis dipstick   No orders of the defined types were placed in this encounter.

## 2018-02-13 LAB — COMPREHENSIVE METABOLIC PANEL
ALT: 15 U/L (ref 0–53)
AST: 16 U/L (ref 0–37)
Albumin: 4.4 g/dL (ref 3.5–5.2)
Alkaline Phosphatase: 46 U/L (ref 39–117)
BUN: 18 mg/dL (ref 6–23)
CALCIUM: 9.7 mg/dL (ref 8.4–10.5)
CHLORIDE: 103 meq/L (ref 96–112)
CO2: 27 meq/L (ref 19–32)
Creatinine, Ser: 1.03 mg/dL (ref 0.40–1.50)
GFR: 77.01 mL/min (ref >60.00)
Glucose, Bld: 98 mg/dL (ref 70–99)
Potassium: 4.1 mEq/L (ref 3.5–5.1)
Sodium: 138 mEq/L (ref 135–145)
Total Bilirubin: 0.6 mg/dL (ref 0.2–1.2)
Total Protein: 7.1 g/dL (ref 6.0–8.3)

## 2018-03-16 ENCOUNTER — Other Ambulatory Visit: Payer: Self-pay

## 2018-03-16 ENCOUNTER — Ambulatory Visit: Payer: PRIVATE HEALTH INSURANCE | Admitting: Family Medicine

## 2018-03-16 ENCOUNTER — Encounter: Payer: Self-pay | Admitting: Family Medicine

## 2018-03-16 VITALS — BP 136/84 | HR 62 | Temp 97.8°F | Resp 16 | Ht 71.0 in | Wt 237.2 lb

## 2018-03-16 DIAGNOSIS — Z23 Encounter for immunization: Secondary | ICD-10-CM | POA: Diagnosis not present

## 2018-03-16 DIAGNOSIS — Z125 Encounter for screening for malignant neoplasm of prostate: Secondary | ICD-10-CM

## 2018-03-16 DIAGNOSIS — E782 Mixed hyperlipidemia: Secondary | ICD-10-CM

## 2018-03-16 DIAGNOSIS — I10 Essential (primary) hypertension: Secondary | ICD-10-CM

## 2018-03-16 DIAGNOSIS — Z0001 Encounter for general adult medical examination with abnormal findings: Secondary | ICD-10-CM | POA: Diagnosis not present

## 2018-03-16 DIAGNOSIS — N50811 Right testicular pain: Secondary | ICD-10-CM

## 2018-03-16 DIAGNOSIS — E039 Hypothyroidism, unspecified: Secondary | ICD-10-CM | POA: Diagnosis not present

## 2018-03-16 DIAGNOSIS — Z0000 Encounter for general adult medical examination without abnormal findings: Secondary | ICD-10-CM

## 2018-03-16 DIAGNOSIS — R7301 Impaired fasting glucose: Secondary | ICD-10-CM

## 2018-03-16 LAB — CBC WITH DIFFERENTIAL/PLATELET
BASOS PCT: 0.9 % (ref 0.0–3.0)
Basophils Absolute: 0 10*3/uL (ref 0.0–0.1)
EOS PCT: 3.2 % (ref 0.0–5.0)
Eosinophils Absolute: 0.2 10*3/uL (ref 0.0–0.7)
HCT: 46 % (ref 39.0–52.0)
HEMOGLOBIN: 15.8 g/dL (ref 13.0–17.0)
Lymphocytes Relative: 36.6 % (ref 12.0–46.0)
Lymphs Abs: 1.8 10*3/uL (ref 0.7–4.0)
MCHC: 34.4 g/dL (ref 30.0–36.0)
MCV: 86.6 fl (ref 78.0–100.0)
MONO ABS: 0.4 10*3/uL (ref 0.1–1.0)
Monocytes Relative: 7.7 % (ref 3.0–12.0)
Neutro Abs: 2.6 10*3/uL (ref 1.4–7.7)
Neutrophils Relative %: 51.6 % (ref 43.0–77.0)
Platelets: 169 10*3/uL (ref 150.0–400.0)
RBC: 5.31 Mil/uL (ref 4.22–5.81)
RDW: 12.7 % (ref 11.5–15.5)
WBC: 5 10*3/uL (ref 4.0–10.5)

## 2018-03-16 LAB — POCT URINALYSIS DIPSTICK
Bilirubin, UA: NEGATIVE
Blood, UA: NEGATIVE
GLUCOSE UA: NEGATIVE
Ketones, UA: NEGATIVE
LEUKOCYTES UA: NEGATIVE
Nitrite, UA: NEGATIVE
Protein, UA: NEGATIVE
Spec Grav, UA: 1.015 (ref 1.010–1.025)
Urobilinogen, UA: 0.2 E.U./dL
pH, UA: 6 (ref 5.0–8.0)

## 2018-03-16 LAB — PSA: PSA: 0.26 ng/mL (ref 0.10–4.00)

## 2018-03-16 LAB — COMPREHENSIVE METABOLIC PANEL
ALT: 14 U/L (ref 0–53)
AST: 18 U/L (ref 0–37)
Albumin: 4.3 g/dL (ref 3.5–5.2)
Alkaline Phosphatase: 49 U/L (ref 39–117)
BUN: 17 mg/dL (ref 6–23)
CHLORIDE: 103 meq/L (ref 96–112)
CO2: 26 meq/L (ref 19–32)
CREATININE: 1.05 mg/dL (ref 0.40–1.50)
Calcium: 9.3 mg/dL (ref 8.4–10.5)
GFR: 75.3 mL/min (ref >60.00)
GLUCOSE: 105 mg/dL — AB (ref 70–99)
Potassium: 4 mEq/L (ref 3.5–5.1)
SODIUM: 139 meq/L (ref 135–145)
Total Bilirubin: 1.1 mg/dL (ref 0.2–1.2)
Total Protein: 6.9 g/dL (ref 6.0–8.3)

## 2018-03-16 LAB — LIPID PANEL
CHOL/HDL RATIO: 4
CHOLESTEROL: 160 mg/dL (ref 0–200)
HDL: 40.2 mg/dL (ref >39.00)
NONHDL: 119.34
Triglycerides: 211 mg/dL — ABNORMAL HIGH (ref 0.0–149.0)
VLDL: 42.2 mg/dL — ABNORMAL HIGH (ref 0.0–40.0)

## 2018-03-16 LAB — LDL CHOLESTEROL, DIRECT: Direct LDL: 84 mg/dL

## 2018-03-16 LAB — HEMOGLOBIN A1C: Hgb A1c MFr Bld: 5.9 % (ref 4.6–6.5)

## 2018-03-16 LAB — TSH: TSH: 0.88 u[IU]/mL (ref 0.35–4.50)

## 2018-03-16 MED ORDER — DICLOFENAC SODIUM 1 % TD GEL: 2.0000 g | Freq: Four times a day (QID) | TRANSDERMAL | 3 refills | Status: AC | PRN

## 2018-03-16 MED ORDER — FUROSEMIDE 20 MG PO TABS: 20.0000 mg | ORAL_TABLET | Freq: Every day | ORAL | 5 refills | Status: DC | PRN

## 2018-03-16 NOTE — Progress Notes (Signed)
Subjective  Chief Complaint  Patient presents with  . Annual Exam    Still having some symptoms from last visit  . Abdominal Pain  . Hypothyroidism  . Hypertension    HPI: Rodney Pineda is a 67 y.o. male who presents to [place] at [facility] today for a Male Wellness Visit. He also has the concerns and/or needs as listed above in the chief complaint. These will be addressed in addition to the Health Maintenance Visit.   Wellness Visit: annual visit with health maintenance review and exam    Overall doing well. Starting to eat better. Weight is doing a little better. Feels well. Did have some RL back pain last week after a busy work week that has since resolved. No radicular sxs. Eye exam is up to date.   HM: due for Prevnar. Colon cancer screen due in June with Dr. Collene Mares. Not yet scheduled.   ROS: negative for sxs of hyperglycemia Lifestyle: Body mass index is 33.08 kg/m. Wt Readings from Last 3 Encounters:  03/16/18 237 lb 3.2 oz (107.6 kg)  02/12/18 239 lb 9.6 oz (108.7 kg)  11/28/17 238 lb 4 oz (108.1 kg)   Diet: low fat Exercise: rarely,   Chronic disease management visit and/or acute problem visit:  HTN: with strong white coat component: home readings remain normal at 130/80. No cp or sob. Tolerating all meds. Has intermittent LE edema treated with lasix.   Hypothyroidism: stable clinically. Due for recheck. Compliant with meds.   Hyperlipidemia on statin: tolerating well.   Testicular / abdominal pain on right: no longer with significant abdominal pain but did have some constipation (was eating too much cheese) and having intermittent right testicular soreness. No masses, lumps, bulges. 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 Declines further evaluation now.    Patient Active Problem List   Diagnosis Date Noted  . Basal cell carcinoma (BCC) of skin of left upper extremity including shoulder 03/12/2017    . Colon polyps 05/01/2015    Overview:  2016, q 5 year surveillance. Dr. Juanita Craver   . Family history of colon cancer 01/31/2014    Overview:  Mother and maternal aunt, 67s   . IFG (impaired fasting glucose) 05/07/2011    Overview:  Mild: 105 01/2013/cla   . Acquired hypothyroidism 01/28/2011  . Benign essential hypertension 01/28/2011    Overview:  White coat component; elevated readings in office/cla   . Mixed hyperlipidemia 01/28/2011    Overview:    . Osteoarthritis, 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  . Allergic rhinitis 07/11/2010    Overview:  10/1 IMO update   . BMI 30.0-30.9,adult 04/17/2010    Health Maintenance  Topic Date Due  . HIV Screening  01/15/1966  . PNA vac Low Risk Adult (1 of 2 - PCV13) 01/14/2018  . INFLUENZA VACCINE  11/28/2018 (Originally 06/25/2018)  . COLONOSCOPY  04/30/2018  . TETANUS/TDAP  08/15/2019  . Hepatitis C Screening  Completed   Immunization History  Administered Date(s) Administered  . Tdap 08/14/2009  . Zoster 01/31/2014   We updated and reviewed the patient's past history in detail and it is documented below. Allergies: Patient is allergic to hydrochlorothiazide and niacin and related. Past Medical History  has a past medical history of Disease of thyroid gland, Family history of colon cancer, Hyperlipidemia, Hypertension, and Snake envenomation. Past Surgical History Patient  has a past surgical history that includes Appendectomy (1995) and Cholecystectomy (2010). Social History Patient  reports that he has never smoked. He has never used smokeless tobacco. He reports that he does not drink alcohol or use drugs. Family History family history includes Cancer in his maternal grandmother; Colon cancer in his maternal aunt and mother; Hyperlipidemia in his father; Hypertension in his brother, father, sister, and son; Lung cancer in his maternal aunt. Review of Systems: Constitutional: negative for fever or malaise Ophthalmic: negative for  photophobia, double vision or loss of vision Cardiovascular: negative for chest pain, dyspnea on exertion, or new LE swelling Respiratory: negative for SOB or persistent cough Gastrointestinal: negative for abdominal pain, change in bowel habits or melena Genitourinary: negative for dysuria or gross hematuria Musculoskeletal: negative for new gait disturbance or muscular weakness Integumentary: negative for new or persistent rashes Neurological: negative for TIA or stroke symptoms Psychiatric: negative for SI or delusions Allergic/Immunologic: negative for hives  Patient Care Team    Relationship Specialty Notifications Start End  Leamon Arnt, MD PCP - General Family Medicine  03/01/13   Juanita Craver, MD Consulting Physician Gastroenterology  03/16/18    Objective  Vitals: BP 136/84 Comment: rechecked after resting  Pulse 62   Temp 97.8 F (36.6 C) (Oral)   Resp 16   Ht 5\' 11"  (1.803 m)   Wt 237 lb 3.2 oz (107.6 kg)   SpO2 96%   BMI 33.08 kg/m  General:  Well developed, well nourished, no acute distress  Psych:  Alert and orientedx3,normal mood and affect HEENT:  Normocephalic, atraumatic, non-icteric sclera, PERRL, oropharynx is clear without mass or exudate, supple neck without adenopathy, mass or thyromegaly Cardiovascular:  Normal S1, S2, RRR without gallop, rub or murmur, nondisplaced PMI, +2 distal pulses in bilateral upper and lower extremities. Respiratory:  Good breath sounds bilaterally, CTAB with normal respiratory effort Gastrointestinal: normal bowel sounds, soft, non-tender, no noted masses. No HSM MSK: no deformities, contusions. Joints are without erythema or swelling. Spine and CVA region are nontender Skin:  Warm, no rashes or suspicious lesions noted Neurologic:    Mental status is normal. CN 2-11 are normal. Gross motor and sensory exams are normal. Stable gait. No tremor GU: No inguinal hernias or adenopathy are appreciated bilaterally   Assessment  1.  Annual physical exam   2. Mixed hyperlipidemia   3. Benign essential hypertension   4. Acquired hypothyroidism   5. IFG (impaired fasting glucose)   6. Testicular pain, right      Plan  Male Wellness Visit:  Age appropriate Health Maintenance and Prevention measures were discussed with patient. Included topics are cancer screening recommendations, ways to keep healthy (see AVS) including dietary and exercise recommendations, regular eye and dental care, use of seat belts, and avoidance of moderate alcohol use and tobacco use. CRC screen in june  BMI: discussed patient's BMI and encouraged positive lifestyle modifications to help get to or maintain a target BMI.  HM needs and immunizations were addressed and ordered. See below for orders. See HM and immunization section for updates. Prevnar today  Routine labs and screening tests ordered including cmp, cbc and lipids where appropriate.   Chronic disease f/u and/or acute problem visit: (deemed necessary to be done in addition to the wellness visit):  Hypertension follow up: doing well by home monitoring. Does ok after lying down in office as well. Continue same medications  Hyperlipidemia f/u: recheck today on statin with lfts Hypothyroidism f/u: due for recheck of TSH; will adjust medications pending labs. Continue taking daily on empty stomach;avoid iron or  Vit D/ca for four hours. Discussed sxs of low and high thyroid levels.   Testicular pain: improving, nl exam. Could be traumatic. Going on vacation this week. He will monitor. IF sxs persist, rec scrotal ultrasound. Pt agress.  I discussed with patient that the coding for this visit will include the wellness/preventive visit along with the code for a chronic problem follow up or acute/sick problem visit. Documentation reflects the need for this.  Follow up: Return in about 6 months (around 09/15/2018) for follow up Hypertension.   Commons side effects, risks, benefits, and  alternatives for medications and treatment plan prescribed today were discussed, and the patient expressed understanding of the given instructions. Patient is instructed to call or message via MyChart if he/she has any questions or concerns regarding our treatment plan. No barriers to understanding were identified. We discussed Red Flag symptoms and signs in detail. Patient expressed understanding regarding what to do in case of urgent or emergency type symptoms.   Medication list was reconciled, printed and provided to the patient in AVS. Patient instructions and summary information was reviewed with the patient as documented in the AVS. This note was prepared with assistance of Dragon voice recognition software. Occasional wrong-word or sound-a-like substitutions may have occurred due to the inherent limitations of voice recognition software  Orders Placed This Encounter  Procedures  . Pneumococcal conjugate vaccine 13-valent  . CBC with Differential/Platelet  . Lipid panel  . TSH  . Comprehensive metabolic panel  . Hemoglobin A1c  . HIV antibody  . POCT urinalysis dipstick   Meds ordered this encounter  Medications  . diclofenac sodium (VOLTAREN) 1 % GEL    Sig: Apply 2 g topically 4 (four) times daily as needed.    Dispense:  400 g    Refill:  3  . furosemide (LASIX) 20 MG tablet    Sig: Take 1 tablet (20 mg total) by mouth daily as needed.    Dispense:  30 tablet    Refill:  5

## 2018-03-16 NOTE — Addendum Note (Signed)
Addended by: Glenetta Borg on: 03/16/2018 11:19 AM   Modules accepted: Orders

## 2018-03-16 NOTE — Patient Instructions (Addendum)
Please return in 6 months for blood pressure check.  You should be getting an appointment reminder from Dr. Collene Mares for your colonoscopy that will be due this June. Please call in may if needed.   If you have any questions or concerns, please don't hesitate to send me a message via MyChart or call the office at [phone]. Thank you for visiting with [place] today! It's our pleasure caring for you.  Please do these things to maintain good health!   Exercise at least 30-45 minutes a day,  4-5 days a week.   Eat a low-fat diet with lots of fruits and vegetables, up to 7-9 servings per day.  Drink plenty of water daily. Try to drink 8 8oz glasses per day.  Seatbelts can save your life. Always wear your seatbelt.  Place Smoke Detectors on every level of your home and check batteries every year.  Eye Doctor - have an eye exam every 1-2 years  Safe sex - use condoms to protect yourself from STDs if you could be exposed to these types of infections.  Avoid heavy alcohol use. If you drink, keep it to less than 2 drinks/day and not every day.  [place].  Choose someone you trust that could speak for you if you became unable to speak for yourself.  Depression is common in our stressful world.If you're feeling down or losing interest in things you normally enjoy, please come in for a visit.

## 2018-03-17 ENCOUNTER — Other Ambulatory Visit: Payer: Self-pay | Admitting: Family Medicine

## 2018-03-17 LAB — HIV ANTIBODY (ROUTINE TESTING W REFLEX): HIV: NONREACTIVE

## 2018-09-14 ENCOUNTER — Ambulatory Visit: Payer: PRIVATE HEALTH INSURANCE | Admitting: Family Medicine

## 2018-09-14 ENCOUNTER — Encounter: Payer: Self-pay | Admitting: Family Medicine

## 2018-09-14 ENCOUNTER — Other Ambulatory Visit: Payer: Self-pay

## 2018-09-14 VITALS — BP 138/86 | HR 64 | Temp 97.8°F | Resp 18 | Ht 71.0 in | Wt 236.0 lb

## 2018-09-14 DIAGNOSIS — R7301 Impaired fasting glucose: Secondary | ICD-10-CM

## 2018-09-14 DIAGNOSIS — Z6830 Body mass index (BMI) 30.0-30.9, adult: Secondary | ICD-10-CM

## 2018-09-14 DIAGNOSIS — Z23 Encounter for immunization: Secondary | ICD-10-CM

## 2018-09-14 DIAGNOSIS — I10 Essential (primary) hypertension: Secondary | ICD-10-CM

## 2018-09-14 DIAGNOSIS — E782 Mixed hyperlipidemia: Secondary | ICD-10-CM | POA: Diagnosis not present

## 2018-09-14 LAB — POCT GLYCOSYLATED HEMOGLOBIN (HGB A1C): Hemoglobin A1C: 5.4 % (ref 4.0–5.6)

## 2018-09-14 NOTE — Patient Instructions (Addendum)
Please return in 6 months for your annual complete physical; please come fasting.  Breakfast: sweet potatoe or banana in bottom of bowl, with rolled oats, almond milk, blacberries, blue berries, strawberries, grapes, peaches, nectarines with nutmeg and cinnamon. Can add chia seads and /or flax meal. Walnuts, almonds or granola for crunch.   If you have any questions or concerns, please don't hesitate to send me a message via MyChart or call the office at [phone]. Thank you for visiting with [place] today! It's our pleasure caring for you.

## 2018-09-14 NOTE — Progress Notes (Signed)
120s Subjective  CC:  Chief Complaint  Patient presents with  . Hypertension  . Hyperlipidemia  . Hyperglycemia    HPI: Rodney Pineda is a 67 y.o. male who presents to the office today for follow up of diabetes, hypertension and problems listed above in the chief complaint.   IFG f/u: His glucose control is reported as Improved. Eating better.  He denies exertional CP or SOB or symptomatic hypoglycemia. He denies foot sores.    Hypertension f/u: Control is good . Pt reports he is doing well. taking medications as instructed, no medication side effects noted, home BP monitoring in range of 465K systolic over 35W diastolic, no TIAs, no chest pain on exertion, no dyspnea on exertion, no swelling of ankles.  He denies adverse effects from his BP medications. Compliance with medication is good.    Hyperlipidemia f/u: Patient presents for follow up of lipids. Lipids have been well controlled on meds w/o myalgias or side effects.mixed with elevated trigs now on fish oil bid.  Compliance with treatment thus far has been fair. The patient does not use medications that may worsen dyslipidemias (corticosteroids, progestins, anabolic steroids, diuretics, beta-blockers, amiodarone, cyclosporine, olanzapine). The patient exercises intermittently. The patient is not known to have coexisting coronary artery disease.    Assessment  1. Benign essential hypertension   2. Mixed hyperlipidemia   3. IFG (impaired fasting glucose)   4. BMI 30.0-30.9,adult      Plan   IFG is currently resolved. Continue healthier diet.   Hypertension is currently very well controlled. Still with mild white coat component  Hyperlipidemia f/u:  Will recheck fasting in 6 months on statin and fish oil . Discussed healthy diet.   Hypertension education: ongoing education regarding management of these chronic disease states was given. Management strategies discussed on successive visits include dietary and exercise  recommendations, goals of achieving and maintaining IBW, and lifestyle modifications aiming for adequate sleep and minimizing stressors.   Follow up: Return in about 6 months (around 03/16/2019) for complete physical..  Orders Placed This Encounter  Procedures  . POCT HgB A1C   No orders of the defined types were placed in this encounter.     I reviewed the patients updated PMH, FH, and SocHx.  Patient Active Problem List   Diagnosis Date Noted  . Basal cell carcinoma (BCC) of skin of left upper extremity including shoulder 03/12/2017  . Colon polyps 05/01/2015  . Family history of colon cancer 01/31/2014  . IFG (impaired fasting glucose) 05/07/2011  . Acquired hypothyroidism 01/28/2011  . Benign essential hypertension 01/28/2011  . Mixed hyperlipidemia 01/28/2011  . Osteoarthritis, 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  . Allergic rhinitis 07/11/2010  . BMI 30.0-30.9,adult 04/17/2010   Immunization History  Administered Date(s) Administered  . Pneumococcal Conjugate-13 03/16/2018  . Tdap 08/14/2009  . Zoster 01/31/2014   Health Maintenance  Topic Date Due  . COLONOSCOPY  04/30/2018  . INFLUENZA VACCINE  11/28/2018 (Originally 06/25/2018)  . PNA vac Low Risk Adult (2 of 2 - PPSV23) 03/17/2019  . TETANUS/TDAP  08/15/2019  . Hepatitis C Screening  Completed  . HIV Screening  Completed   Diabetes and HTN Related Lab Review: Lab Results  Component Value Date   HGBA1C 5.9 03/16/2018    No results found for: Derl Barrow Lab Results  Component Value Date   CREATININE 1.05 03/16/2018   BUN 17 03/16/2018   NA 139 03/16/2018   K 4.0 03/16/2018   CL 103 03/16/2018   CO2 26 03/16/2018  Lab Results  Component Value Date   CHOL 160 03/16/2018   CHOL  09/06/2009    120        ATP III CLASSIFICATION:  <200     mg/dL   Desirable  200-239  mg/dL   Borderline High  >=240    mg/dL   High          Lab Results  Component Value Date   HDL 40.20 03/16/2018   HDL 38 (L) 09/06/2009    Lab Results  Component Value Date   [facility]  09/06/2009    64        Total Cholesterol/HDL:CHD Risk Coronary Heart Disease Risk Table                     Men   Women  1/2 Average Risk   3.4   3.3  Average Risk       5.0   4.4  2 X Average Risk   9.6   7.1  3 X Average Risk  23.4   11.0        Use the calculated Patient Ratio above and the CHD Risk Table to determine the patient's CHD Risk.        ATP III CLASSIFICATION (LDL):  <100     mg/dL   Optimal  100-129  mg/dL   Near or Above                    Optimal  130-159  mg/dL   Borderline  160-189  mg/dL   High  >190     mg/dL   Very High   Lab Results  Component Value Date   TRIG 211.0 (H) 03/16/2018   TRIG 90 09/06/2009   Lab Results  Component Value Date   CHOLHDL 4 03/16/2018   CHOLHDL 3.2 09/06/2009   Lab Results  Component Value Date   LDLDIRECT 84.0 03/16/2018   The 10-year ASCVD risk score Mikey Bussing DC Jr., et al., 2013) is: 16.2%   Values used to calculate the score:     Age: 34 years     Sex: Male     Is Non-Hispanic African American: No     Diabetic: No     Tobacco smoker: No     Systolic Blood Pressure: 025 mmHg     Is BP treated: Yes     HDL Cholesterol: 40.2 mg/dL     Total Cholesterol: 160 mg/dL  BP Readings from Last 3 Encounters:  09/14/18 138/86  03/16/18 136/84  02/12/18 (!) 142/92   Wt Readings from Last 3 Encounters:  09/14/18 236 lb (107 kg)  03/16/18 237 lb 3.2 oz (107.6 kg)  02/12/18 239 lb 9.6 oz (108.7 kg)    Allergies: Patient is allergic to hydrochlorothiazide and niacin and related. Family History: Patient family history includes Cancer in his maternal grandmother; Colon cancer in his maternal aunt and mother; Hyperlipidemia in his father; Hypertension in his brother, father, sister, and son; Lung cancer in his maternal aunt. Social History:  Patient  reports that he has never smoked. He has never used smokeless tobacco. He reports that he does not drink alcohol or use  drugs.  Review of Systems: Ophthalmic: negative for eye pain, loss of vision or double vision Cardiovascular: negative for chest pain Respiratory: negative for SOB or persistent cough Gastrointestinal: negative for abdominal pain Genitourinary: negative for dysuria or gross hematuria MSK: negative for foot lesions Neurologic: negative for weakness or gait  disturbance Current Meds  Medication Sig  . amLODipine (NORVASC) 5 MG tablet Take 1 tablet (5 mg total) by mouth daily.  . cetirizine (ZYRTEC) 10 MG tablet   . CINNAMON PO Take 1 tablet by mouth daily.   . diclofenac sodium (VOLTAREN) 1 % GEL Apply 2 g topically 4 (four) times daily as needed.  Marland Kitchen esomeprazole (NEXIUM) 20 MG capsule Take 20 mg by mouth as needed.   . fish oil-omega-3 fatty acids 1000 MG capsule Take 1 g by mouth 2 (two) times daily.  . Flaxseed, Linseed, 1000 MG CAPS Take 1,000 mg by mouth 2 (two) times daily.  . furosemide (LASIX) 20 MG tablet Take 1 tablet (20 mg total) by mouth daily as needed.  Marland Kitchen GARLIC PO Take 1 tablet by mouth daily.  Marland Kitchen levothyroxine (SYNTHROID, LEVOTHROID) 137 MCG tablet Take 1 tablet (137 mcg total) by mouth daily.  Marland Kitchen lisinopril (PRINIVIL,ZESTRIL) 40 MG tablet Take 1 tablet (40 mg total) by mouth daily.  . metoprolol succinate (TOPROL-XL) 100 MG 24 hr tablet Take 1 tablet (100 mg total) by mouth daily.  . Red Yeast Rice Extract 600 MG CAPS   . simvastatin (ZOCOR) 40 MG tablet Take 1 tablet (40 mg total) by mouth every evening.  . TURMERIC PO Take 1 tablet by mouth daily.    Objective  Vitals: BP 138/86   Pulse 64   Temp 97.8 F (36.6 C) (Oral)   Resp 18   Ht 5\' 11"  (1.803 m)   Wt 236 lb (107 kg)   SpO2 97%   BMI 32.92 kg/m  General: well appearing, no acute distress  Psych:  Alert and oriented, normal mood and affect HEENT:  Normocephalic, atraumatic, moist mucous membranes, supple neck  Cardiovascular:  Nl S1 and S2, RRR without murmur, gallop or rub. no edema Respiratory:  Good  breath sounds bilaterally, CTAB with normal effort, no rales Gastrointestinal: normal BS, soft, nontender Skin:  Warm, no rashes Neurologic:   Mental status is normal. normal gait Foot exam: no erythema, pallor, or cyanosis visible nl proprioception and sensation to monofilament testing bilaterally, +2 distal pulses bilaterally   Commons side effects, risks, benefits, and alternatives for medications and treatment plan prescribed today were discussed, and the patient expressed understanding of the given instructions. Patient is instructed to call or message via MyChart if he/she has any questions or concerns regarding our treatment plan. No barriers to understanding were identified. We discussed Red Flag symptoms and signs in detail. Patient expressed understanding regarding what to do in case of urgent or emergency type symptoms.   Medication list was reconciled, printed and provided to the patient in AVS. Patient instructions and summary information was reviewed with the patient as documented in the AVS. This note was prepared with assistance of Dragon voice recognition software. Occasional wrong-word or sound-a-like substitutions may have occurred due to the inherent limitations of voice recognition software

## 2018-12-09 ENCOUNTER — Other Ambulatory Visit: Payer: Self-pay | Admitting: Family Medicine

## 2019-03-04 ENCOUNTER — Encounter: Payer: Self-pay | Admitting: Family Medicine

## 2019-03-18 ENCOUNTER — Encounter: Payer: PRIVATE HEALTH INSURANCE | Admitting: Family Medicine

## 2019-05-21 ENCOUNTER — Encounter: Payer: PRIVATE HEALTH INSURANCE | Admitting: Family Medicine

## 2019-05-21 ENCOUNTER — Telehealth: Payer: Self-pay | Admitting: Family Medicine

## 2019-05-21 NOTE — Telephone Encounter (Signed)
I have not called pt, could have possibly been [place] but sh eis not in office today.

## 2019-05-21 NOTE — Telephone Encounter (Signed)
Patient states he received a call about rescheduling his appt on 6/30.  I don't see any notes about this and the appt isn't marked as needing to be rescheduled. Please advise - thanks!

## 2019-05-25 ENCOUNTER — Encounter: Payer: Self-pay | Admitting: Family Medicine

## 2019-05-25 ENCOUNTER — Other Ambulatory Visit: Payer: Self-pay

## 2019-05-25 ENCOUNTER — Ambulatory Visit (INDEPENDENT_AMBULATORY_CARE_PROVIDER_SITE_OTHER): Payer: PRIVATE HEALTH INSURANCE | Admitting: Family Medicine

## 2019-05-25 VITALS — BP 138/84 | HR 66 | Temp 98.7°F | Resp 16 | Ht 71.0 in | Wt 241.6 lb

## 2019-05-25 DIAGNOSIS — E782 Mixed hyperlipidemia: Secondary | ICD-10-CM

## 2019-05-25 DIAGNOSIS — Z0000 Encounter for general adult medical examination without abnormal findings: Secondary | ICD-10-CM | POA: Diagnosis not present

## 2019-05-25 DIAGNOSIS — Z23 Encounter for immunization: Secondary | ICD-10-CM

## 2019-05-25 DIAGNOSIS — R7301 Impaired fasting glucose: Secondary | ICD-10-CM | POA: Diagnosis not present

## 2019-05-25 DIAGNOSIS — H8110 Benign paroxysmal vertigo, unspecified ear: Secondary | ICD-10-CM

## 2019-05-25 DIAGNOSIS — I10 Essential (primary) hypertension: Secondary | ICD-10-CM | POA: Diagnosis not present

## 2019-05-25 DIAGNOSIS — E039 Hypothyroidism, unspecified: Secondary | ICD-10-CM

## 2019-05-25 LAB — COMPREHENSIVE METABOLIC PANEL
ALT: 19 U/L (ref 0–53)
AST: 21 U/L (ref 0–37)
Albumin: 4.3 g/dL (ref 3.5–5.2)
Alkaline Phosphatase: 48 U/L (ref 39–117)
BUN: 14 mg/dL (ref 6–23)
CO2: 26 mEq/L (ref 19–32)
Calcium: 9.3 mg/dL (ref 8.4–10.5)
Chloride: 104 mEq/L (ref 96–112)
Creatinine, Ser: 0.93 mg/dL (ref 0.40–1.50)
GFR: 81.2 mL/min (ref >60.00)
Glucose, Bld: 103 mg/dL — ABNORMAL HIGH (ref 70–99)
Potassium: 4.2 mEq/L (ref 3.5–5.1)
Sodium: 139 mEq/L (ref 135–145)
Total Bilirubin: 1 mg/dL (ref 0.2–1.2)
Total Protein: 6.8 g/dL (ref 6.0–8.3)

## 2019-05-25 LAB — CBC WITH DIFFERENTIAL/PLATELET
Basophils Absolute: 0 10*3/uL (ref 0.0–0.1)
Basophils Relative: 0.7 % (ref 0.0–3.0)
Eosinophils Absolute: 0.2 10*3/uL (ref 0.0–0.7)
Eosinophils Relative: 3.6 % (ref 0.0–5.0)
HCT: 44.4 % (ref 39.0–52.0)
Hemoglobin: 14.6 g/dL (ref 13.0–17.0)
Lymphocytes Relative: 37.6 % (ref 12.0–46.0)
Lymphs Abs: 1.9 10*3/uL (ref 0.7–4.0)
MCHC: 32.8 g/dL (ref 30.0–36.0)
MCV: 88.2 fl (ref 78.0–100.0)
Monocytes Absolute: 0.4 10*3/uL (ref 0.1–1.0)
Monocytes Relative: 7.9 % (ref 3.0–12.0)
Neutro Abs: 2.6 10*3/uL (ref 1.4–7.7)
Neutrophils Relative %: 50.2 % (ref 43.0–77.0)
Platelets: 194 10*3/uL (ref 150.0–400.0)
RBC: 5.03 Mil/uL (ref 4.22–5.81)
RDW: 13 % (ref 11.5–15.5)
WBC: 5.1 10*3/uL (ref 4.0–10.5)

## 2019-05-25 LAB — POCT URINALYSIS DIPSTICK
Bilirubin, UA: NEGATIVE
Blood, UA: NEGATIVE
Glucose, UA: NEGATIVE
Ketones, UA: NEGATIVE
Leukocytes, UA: NEGATIVE
Nitrite, UA: NEGATIVE
Protein, UA: NEGATIVE
Spec Grav, UA: 1.01 (ref 1.010–1.025)
Urobilinogen, UA: 0.2 E.U./dL
pH, UA: 6 (ref 5.0–8.0)

## 2019-05-25 LAB — LIPID PANEL
Cholesterol: 134 mg/dL (ref 0–200)
HDL: 39.2 mg/dL (ref >39.00)
LDL Cholesterol: 62 mg/dL (ref 0–99)
NonHDL: 95
Total CHOL/HDL Ratio: 3
Triglycerides: 163 mg/dL — ABNORMAL HIGH (ref 0.0–149.0)
VLDL: 32.6 mg/dL (ref 0.0–40.0)

## 2019-05-25 LAB — TSH: TSH: 0.5 u[IU]/mL (ref 0.35–4.50)

## 2019-05-25 LAB — HEMOGLOBIN A1C: Hgb A1c MFr Bld: 6 % (ref 4.6–6.5)

## 2019-05-25 NOTE — Patient Instructions (Addendum)
Please return in 6 months for follow up of your hypertension.  I will release your lab results to you on your MyChart account with further instructions. Please reply with any questions.   Today you were given your Pneumovax (pneumococcal 23) and Tdap vaccinations.   Start strength training. Can do matt and floor exercises with body weight or resistance bands. Look it up on you tube.   If you have any questions or concerns, please don't hesitate to send me a message via MyChart or call the office at [phone]. Thank you for visiting with [place] today! It's our pleasure caring for you.  Please do these things to maintain good health!   Exercise at least 30-45 minutes a day,  4-5 days a week.   Eat a low-fat diet with lots of fruits and vegetables, up to 7-9 servings per day.  Drink plenty of water daily. Try to drink 8 8oz glasses per day.  Seatbelts can save your life. Always wear your seatbelt.  Place Smoke Detectors on every level of your home and check batteries every year.  Eye Doctor - have an eye exam every 1-2 years  Safe sex - use condoms to protect yourself from STDs if you could be exposed to these types of infections.  Avoid heavy alcohol use. If you drink, keep it to less than 2 drinks/day and not every day.  [place].  Choose someone you trust that could speak for you if you became unable to speak for yourself.  Depression is common in our stressful world.If you're feeling down or losing interest in things you normally enjoy, please come in for a visit.

## 2019-05-25 NOTE — Progress Notes (Signed)
Subjective  Chief Complaint  Patient presents with  . Annual Exam    He is fasting  . Hypertension    He reports that his home readings have been a little elevated  . Hyperlipidemia  . Dizziness    He reports that he wakes up ocassionally with dizziness that certain movements cause the episode to last longer.Marland Kitchen He denies headaches, blurred vision, and nauses.Marland Kitchen He reports he does not check his BP during these episodes and reports some bilateral ear pain at times  . Hypothyroidism    HPI: Rodney Pineda is a 68 y.o. male who presents to [facility] [place] today for a Male Wellness Visit. He also has the concerns and/or needs as listed above in the chief complaint. These will be addressed in addition to the Health Maintenance Visit.   Wellness Visit: annual visit with health maintenance review and exam    HM: doing well. Feels well. Has gained some weight - he's not sure why; diet is unchanged. Still no exercise. Due tdap and pneumovax. Fasting for labs today.  Lifestyle: Body mass index is 33.7 kg/m. Wt Readings from Last 3 Encounters:  05/25/19 241 lb 9.6 oz (109.6 kg)  09/14/18 236 lb (107 kg)  03/16/18 237 lb 3.2 oz (107.6 kg)    Chronic disease management visit and/or acute problem visit:  Hypertension f/u: Control is fair . Pt reports he is doing well. taking medications as instructed, no medication side effects noted, home BP monitoring in range of 465-681E systolic over 75-17G diastolic, no TIAs, no chest pain on exertion, no dyspnea on exertion, no swelling of ankles. Still with anxiety component. 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  He denies adverse effects from his BP medications. Compliance with medication is good.   HLD fu: doing well on statin w/o AEs. Had been well controlled.   Hypothyroidism: no sxs of high or low thyroid outside of weight gain. Compliant with meds. Due for recheck.   H/o IFG w/o sxs of hyperglycemia.   Vertigo with head mvt x 1-2  weeks; worse in am when gets up from lying down. Also happened when checking side view mirror while driving. No paresis or ataxia. Used meclizine from wife with benefit. No HA, diplopia, n/v or hearing loss.  BP Readings from Last 3 Encounters:  05/25/19 138/84  09/14/18 138/86  03/16/18 136/84   Wt Readings from Last 3 Encounters:  05/25/19 241 lb 9.6 oz (109.6 kg)  09/14/18 236 lb (107 kg)  03/16/18 237 lb 3.2 oz (107.6 kg)    Lab Results  Component Value Date   CHOL 160 03/16/2018   CHOL  09/06/2009    120        ATP III CLASSIFICATION:  <200     mg/dL   Desirable  200-239  mg/dL   Borderline High  >=240    mg/dL   High          Lab Results  Component Value Date   HDL 40.20 03/16/2018   HDL 38 (L) 09/06/2009   Lab Results  Component Value Date   [facility]  09/06/2009    64        Total Cholesterol/HDL:CHD Risk Coronary Heart Disease Risk Table                     Men   Women  1/2 Average Risk   3.4   3.3  Average Risk  5.0   4.4  2 X Average Risk   9.6   7.1  3 X Average Risk  23.4   11.0        Use the calculated Patient Ratio above and the CHD Risk Table to determine the patient's CHD Risk.        ATP III CLASSIFICATION (LDL):  <100     mg/dL   Optimal  100-129  mg/dL   Near or Above                    Optimal  130-159  mg/dL   Borderline  160-189  mg/dL   High  >190     mg/dL   Very High   Lab Results  Component Value Date   TRIG 211.0 (H) 03/16/2018   TRIG 90 09/06/2009   Lab Results  Component Value Date   CHOLHDL 4 03/16/2018   CHOLHDL 3.2 09/06/2009   Lab Results  Component Value Date   LDLDIRECT 84.0 03/16/2018   Lab Results  Component Value Date   CREATININE 1.05 03/16/2018   BUN 17 03/16/2018   NA 139 03/16/2018   K 4.0 03/16/2018   CL 103 03/16/2018   CO2 26 03/16/2018    The 10-year ASCVD risk score Mikey Bussing DC Jr., et al., 2013) is: 17.5%   Values used to calculate the score:     Age: 68 years     Sex: Male     Is  Non-Hispanic African American: No     Diabetic: No     Tobacco smoker: No     Systolic Blood Pressure: 419 mmHg     Is BP treated: Yes     HDL Cholesterol: 40.2 mg/dL     Total Cholesterol: 160 mg/dL   Patient Active Problem List   Diagnosis Date Noted  . Basal cell carcinoma (BCC) of skin of left upper extremity including shoulder 03/12/2017  . Colon polyps 05/01/2015  . Family history of colon cancer 01/31/2014  . IFG (impaired fasting glucose) 05/07/2011  . Acquired hypothyroidism 01/28/2011  . Benign essential hypertension 01/28/2011  . Mixed hyperlipidemia 01/28/2011  . Osteoarthritis, 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  . Allergic rhinitis 07/11/2010  . BMI 30.0-30.9,adult 04/17/2010   Health Maintenance  Topic Date Due  . PNA vac Low Risk Adult (2 of 2 - PPSV23) 03/17/2019  . INFLUENZA VACCINE  06/26/2019  . TETANUS/TDAP  08/15/2019  . COLONOSCOPY  05/26/2023  . Hepatitis C Screening  Completed   Immunization History  Administered Date(s) Administered  . Influenza, High Dose Seasonal PF 09/14/2018  . Pneumococcal Conjugate-13 03/16/2018  . Pneumococcal Polysaccharide-23 05/25/2019  . Tdap 08/14/2009, 05/25/2019  . Zoster 01/31/2014   We updated and reviewed the patient's past history in detail and it is documented below. Allergies: Patient is allergic to hydrochlorothiazide and niacin and related. Past Medical History  has a past medical history of Disease of thyroid gland, Family history of colon cancer, Hyperlipidemia, Hypertension, and Snake envenomation. Past Surgical History Patient  has a past surgical history that includes Appendectomy (1995) and Cholecystectomy (2010). Social History Patient  reports that he has never smoked. He has never used smokeless tobacco. He reports that he does not drink alcohol or use drugs. Family History family history includes Cancer in his maternal grandmother; Colon cancer in his maternal aunt and mother; Hyperlipidemia in his father;  Hypertension in his brother, father, sister, and son; Lung cancer in his maternal aunt. Review of Systems: Constitutional: negative for fever  or malaise Ophthalmic: negative for photophobia, double vision or loss of vision Cardiovascular: negative for chest pain, dyspnea on exertion, or new LE swelling Respiratory: negative for SOB or persistent cough Gastrointestinal: negative for abdominal pain, change in bowel habits or melena Genitourinary: negative for dysuria or gross hematuria Musculoskeletal: negative for new gait disturbance or muscular weakness Integumentary: negative for new or persistent rashes Neurological: negative for TIA or stroke symptoms Psychiatric: negative for SI or delusions Allergic/Immunologic: negative for hives  Patient Care Team    Relationship Specialty Notifications Start End  Leamon Arnt, MD PCP - General Family Medicine  03/01/13   Juanita Craver, MD Consulting Physician Gastroenterology  03/16/18    Objective  Vitals: BP 138/84   Pulse 66   Temp 98.7 F (37.1 C) (Oral)   Resp 16   Ht 5\' 11"  (1.803 m)   Wt 241 lb 9.6 oz (109.6 kg)   SpO2 97%   BMI 33.70 kg/m  General:  Well developed, well nourished, no acute distress  Psych:  Alert and orientedx3,normal mood and affect HEENT:  Normocephalic, atraumatic, non-icteric sclera, PERRL, oropharynx is clear without mass or exudate, supple neck without adenopathy, mass or thyromegaly Cardiovascular:  Normal S1, S2, RRR without gallop, rub or murmur, nondisplaced PMI, +2 distal pulses in bilateral upper and lower extremities. Respiratory:  Good breath sounds bilaterally, CTAB with normal respiratory effort Gastrointestinal: normal bowel sounds, soft, non-tender, no noted masses. No HSM MSK: no deformities, contusions. Joints are without erythema or swelling. Spine and CVA region are nontender Skin:  Warm, no rashes or suspicious lesions noted Neurologic:    Mental status is normal. CN 2-11 are normal. Gross  motor and sensory exams are normal. Stable gait. No tremor; negative hallpike maneuver GU: No inguinal hernias or adenopathy are appreciated bilaterally   Assessment  1. Annual physical exam   2. IFG (impaired fasting glucose)   3. Acquired hypothyroidism   4. Benign essential hypertension   5. Mixed hyperlipidemia   6. Need for 23-polyvalent pneumococcal polysaccharide vaccine   7. Need for Tdap vaccination   8. Benign paroxysmal positional vertigo, unspecified laterality      Plan  Male Wellness Visit:  Age appropriate Health Maintenance and Prevention measures were discussed with patient. Included topics are cancer screening recommendations, ways to keep healthy (see AVS) including dietary and exercise recommendations, regular eye and dental care, use of seat belts, and avoidance of moderate alcohol use and tobacco use.   BMI: discussed patient's BMI and encouraged positive lifestyle modifications to help get to or maintain a target BMI. Work on Editor, commissioning and diet  HM needs and immunizations were addressed and ordered. See below for orders. See HM and immunization section for updates. tdap and pneumovax  Routine labs and screening tests ordered including cmp, cbc and lipids where appropriate.  Discussed recommendations regarding Vit D and calcium supplementation (see AVS)  Chronic disease f/u and/or acute problem visit: (deemed necessary to be done in addition to the wellness visit):  HTN: doing well still. Pt will work on low sodium diet and weight loss. Will adjust meds up next visit if systolic remains > 810.   HLD and thyroid: stable clinically: recheck labs to ensure stability  IFG: rechec a1c and fasting glucose. Check urine  BPV: educated and discussed safety and appropriate use of meclizine. Improving. No red flag sxs identified.   Follow up: Return in about 6 months (around 11/24/2019) for follow up Hypertension.   Commons side effects,  risks, benefits, and  alternatives for medications and treatment plan prescribed today were discussed, and the patient expressed understanding of the given instructions. Patient is instructed to call or message via MyChart if he/she has any questions or concerns regarding our treatment plan. No barriers to understanding were identified. We discussed Red Flag symptoms and signs in detail. Patient expressed understanding regarding what to do in case of urgent or emergency type symptoms.   Medication list was reconciled, printed and provided to the patient in AVS. Patient instructions and summary information was reviewed with the patient as documented in the AVS. This note was prepared with assistance of Dragon voice recognition software. Occasional wrong-word or sound-a-like substitutions may have occurred due to the inherent limitations of voice recognition software  Orders Placed This Encounter  Procedures  . Pneumococcal polysaccharide vaccine 23-valent greater than or equal to 2yo subcutaneous/IM  . Tdap vaccine greater than or equal to 7yo IM  . Comprehensive metabolic panel  . CBC with Differential/Platelet  . Lipid panel  . TSH  . Hemoglobin A1c  . POCT urinalysis dipstick   No orders of the defined types were placed in this encounter.

## 2019-09-26 LAB — HEMOGLOBIN A1C: Hemoglobin A1C: 5.8

## 2019-11-26 ENCOUNTER — Other Ambulatory Visit: Payer: Self-pay | Admitting: Family Medicine

## 2019-11-30 ENCOUNTER — Ambulatory Visit: Payer: PRIVATE HEALTH INSURANCE | Admitting: Family Medicine

## 2019-12-20 ENCOUNTER — Ambulatory Visit (INDEPENDENT_AMBULATORY_CARE_PROVIDER_SITE_OTHER): Payer: PRIVATE HEALTH INSURANCE | Admitting: Family Medicine

## 2019-12-20 ENCOUNTER — Encounter: Payer: Self-pay | Admitting: Family Medicine

## 2019-12-20 VITALS — BP 136/82 | HR 69 | Temp 94.2°F | Ht 71.0 in | Wt 243.4 lb

## 2019-12-20 DIAGNOSIS — R7301 Impaired fasting glucose: Secondary | ICD-10-CM | POA: Diagnosis not present

## 2019-12-20 DIAGNOSIS — I10 Essential (primary) hypertension: Secondary | ICD-10-CM

## 2019-12-20 DIAGNOSIS — E782 Mixed hyperlipidemia: Secondary | ICD-10-CM

## 2019-12-20 LAB — POCT GLYCOSYLATED HEMOGLOBIN (HGB A1C): Hemoglobin A1C: 5.7 % — AB (ref 4.0–5.6)

## 2019-12-20 NOTE — Patient Instructions (Signed)
Please return in June 2021 for your annual complete physical; please come fasting.  Everything looks good!  If you have any questions or concerns, please don't hesitate to send me a message via MyChart or call the office at [phone]. Thank you for visiting with [place] today! It's our pleasure caring for you.

## 2019-12-20 NOTE — Progress Notes (Signed)
Subjective  CC:  Chief Complaint  Patient presents with  . Hypertension    has been checking over the weekend readings have been 127/70,128/71 and 135/72  . Hyperglycemia    Had A1C checked at work around 11/1 and it was 5.8. Has not been exercising   . Hyperlipidemia    Takes medication no missed pills. No Issues with medications.     HPI: Rodney Pineda is a 69 y.o. male who presents to the office today to address the problems listed above in the chief complaint.  Hypertension f/u: Control is good . Pt reports he is doing well. taking medications as instructed, no medication side effects noted, no TIAs, no chest pain on exertion, no dyspnea on exertion, no swelling of ankles. Home readings on avg 120s/70s w/ an occasional 130s. Still with white coat HTN response when checked at work.  He denies adverse effects from his BP medications. Compliance with medication is good.   IFG and obesity:working on weight loss.still goes up and down. No sxs of hyperglycemia.  HLD doing well on statn.   Assessment  1. Benign essential hypertension   2. IFG (impaired fasting glucose)   3. Mixed hyperlipidemia      Plan    Hypertension f/u: BP control is well controlled. Continue same meds  Hyperlipidemia f/u: at goal on statin  IFG: improving. Counseled on diet.  Education regarding management of these chronic disease states was given. Management strategies discussed on successive visits include dietary and exercise recommendations, goals of achieving and maintaining IBW, and lifestyle modifications aiming for adequate sleep and minimizing stressors.   Follow up: June 2020 for cpe  Orders Placed This Encounter  Procedures  . Hemoglobin A1c  . POCT glycosylated hemoglobin (Hb A1C)   No orders of the defined types were placed in this encounter.     BP Readings from Last 3 Encounters:  12/20/19 136/82  05/25/19 138/84  09/14/18 138/86   Wt Readings from Last 3 Encounters:    12/20/19 243 lb 6.4 oz (110.4 kg)  05/25/19 241 lb 9.6 oz (109.6 kg)  09/14/18 236 lb (107 kg)   Lab Results  Component Value Date   HGBA1C 5.7 (A) 12/20/2019   HGBA1C 5.8 09/26/2019   HGBA1C 6.0 05/25/2019     Lab Results  Component Value Date   CHOL 134 05/25/2019   CHOL 160 03/16/2018   CHOL  09/06/2009    120        ATP III CLASSIFICATION:  <200     mg/dL   Desirable  200-239  mg/dL   Borderline High  >=240    mg/dL   High          Lab Results  Component Value Date   HDL 39.20 05/25/2019   HDL 40.20 03/16/2018   HDL 38 (L) 09/06/2009   Lab Results  Component Value Date   LDLCALC 62 05/25/2019   [place] [employer]  09/06/2009    64        Total Cholesterol/HDL:CHD Risk Coronary Heart Disease Risk Table                     Men   Women  1/2 Average Risk   3.4   3.3  Average Risk       5.0   4.4  2 X Average Risk   9.6   7.1  3 X Average Risk  23.4   11.0        Use  the calculated Patient Ratio above and the CHD Risk Table to determine the patient's CHD Risk.        ATP III CLASSIFICATION (LDL):  <100     mg/dL   Optimal  100-129  mg/dL   Near or Above                    Optimal  130-159  mg/dL   Borderline  160-189  mg/dL   High  >190     mg/dL   Very High   Lab Results  Component Value Date   TRIG 163.0 (H) 05/25/2019   TRIG 211.0 (H) 03/16/2018   TRIG 90 09/06/2009   Lab Results  Component Value Date   CHOLHDL 3 05/25/2019   CHOLHDL 4 03/16/2018   CHOLHDL 3.2 09/06/2009   Lab Results  Component Value Date   LDLDIRECT 84.0 03/16/2018   Lab Results  Component Value Date   CREATININE 0.93 05/25/2019   BUN 14 05/25/2019   NA 139 05/25/2019   K 4.2 05/25/2019   CL 104 05/25/2019   CO2 26 05/25/2019   Lab Results  Component Value Date   HGBA1C 5.7 (A) 12/20/2019   HGBA1C 5.8 09/26/2019   HGBA1C 6.0 05/25/2019     The 10-year ASCVD risk score Mikey Bussing DC Jr., et al., 2013) is: 15.5%   Values used to calculate the score:     Age: 32 years      Sex: Male     Is Non-Hispanic African American: No     Diabetic: No     Tobacco smoker: No     Systolic Blood Pressure: [id number] mmHg     Is BP treated: Yes     HDL Cholesterol: 39.2 mg/dL     Total Cholesterol: 134 mg/dL  I reviewed the patients updated PMH, FH, and SocHx.    Patient Active Problem List   Diagnosis Date Noted  . Basal cell carcinoma (BCC) of skin of left upper extremity including shoulder 03/12/2017  . Colon polyps 05/01/2015  . Family history of colon cancer 01/31/2014  . IFG (impaired fasting glucose) 05/07/2011  . Acquired hypothyroidism 01/28/2011  . Benign essential hypertension 01/28/2011  . Mixed hyperlipidemia 01/28/2011  . Osteoarthritis, 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  . Allergic rhinitis 07/11/2010  . BMI 30.0-30.9,adult 04/17/2010    Allergies: Hydrochlorothiazide and Niacin and related  Social History: Patient  reports that he has never smoked. He has never used smokeless tobacco. He reports that he does not drink alcohol or use drugs.  Current Meds  Medication Sig  . amLODipine (NORVASC) 5 MG tablet TAKE 1 TABLET BY MOUTH DAILY  . CINNAMON PO Take 1 tablet by mouth daily.   . diclofenac sodium (VOLTAREN) 1 % GEL Apply 2 g topically 4 (four) times daily as needed.  . fish oil-omega-3 fatty acids 1000 MG capsule Take 1 g by mouth 2 (two) times daily.  . Flaxseed, Linseed, 1000 MG CAPS Take 1,000 mg by mouth 2 (two) times daily.  . furosemide (LASIX) 20 MG tablet Take 1 tablet (20 mg total) by mouth daily as needed.  Marland Kitchen GARLIC PO Take 1 tablet by mouth daily.  Marland Kitchen levothyroxine (SYNTHROID) 137 MCG tablet TAKE 1 TABLET BY MOUTH EVERY DAY  . lisinopril (ZESTRIL) 40 MG tablet TAKE 1 TABLET BY MOUTH EVERY DAY  . metoprolol succinate (TOPROL-XL) 100 MG 24 hr tablet TAKE 1 TABLET BY MOUTH EVERY DAY  . Red Yeast Rice Extract 600 MG CAPS   .  simvastatin (ZOCOR) 40 MG tablet TAKE 1 TABLET BY MOUTH EVERY MORNING  . TURMERIC PO Take 1 tablet by mouth daily.    Review of  Systems: Cardiovascular: negative for chest pain, palpitations, leg swelling, orthopnea Respiratory: negative for SOB, wheezing or persistent cough Gastrointestinal: negative for abdominal pain Genitourinary: negative for dysuria or gross hematuria  Objective  Vitals: BP 136/82   Pulse 69   Temp (!) 94.2 F (34.6 C) (Temporal)   Ht 5\' 11"  (1.803 m)   Wt 243 lb 6.4 oz (110.4 kg)   SpO2 96%   BMI 33.95 kg/m  General: no acute distress  Psych:  Alert and oriented, normal mood and affect HEENT:  Normocephalic, atraumatic, supple neck  Cardiovascular:  RRR without murmur. no edema Respiratory:  Good breath sounds bilaterally, CTAB with normal respiratory effort Skin:  Warm, no rashes Neurologic:   Mental status is normal  Commons side effects, risks, benefits, and alternatives for medications and treatment plan prescribed today were discussed, and the patient expressed understanding of the given instructions. Patient is instructed to call or message via MyChart if he/she has any questions or concerns regarding our treatment plan. No barriers to understanding were identified. We discussed Red Flag symptoms and signs in detail. Patient expressed understanding regarding what to do in case of urgent or emergency type symptoms.   Medication list was reconciled, printed and provided to the patient in AVS. Patient instructions and summary information was reviewed with the patient as documented in the AVS. This note was prepared with assistance of Dragon voice recognition software. Occasional wrong-word or sound-a-like substitutions may have occurred due to the inherent limitations of voice recognition software  This visit occurred during the SARS-CoV-2 public health emergency.  Safety protocols were in place, including screening questions prior to the visit, additional usage of staff PPE, and extensive cleaning of exam room while observing appropriate contact time as indicated for disinfecting  solutions.

## 2020-07-05 ENCOUNTER — Encounter: Payer: PRIVATE HEALTH INSURANCE | Admitting: Family Medicine

## 2020-09-04 ENCOUNTER — Ambulatory Visit (INDEPENDENT_AMBULATORY_CARE_PROVIDER_SITE_OTHER): Payer: PRIVATE HEALTH INSURANCE | Admitting: Family Medicine

## 2020-09-04 ENCOUNTER — Other Ambulatory Visit: Payer: Self-pay

## 2020-09-04 ENCOUNTER — Encounter: Payer: Self-pay | Admitting: Family Medicine

## 2020-09-04 VITALS — BP 182/102 | HR 63 | Temp 98.2°F | Resp 16 | Ht 71.0 in | Wt 249.0 lb

## 2020-09-04 DIAGNOSIS — M6788 Other specified disorders of synovium and tendon, other site: Secondary | ICD-10-CM

## 2020-09-04 DIAGNOSIS — K635 Polyp of colon: Secondary | ICD-10-CM

## 2020-09-04 DIAGNOSIS — E782 Mixed hyperlipidemia: Secondary | ICD-10-CM | POA: Diagnosis not present

## 2020-09-04 DIAGNOSIS — I10 Essential (primary) hypertension: Secondary | ICD-10-CM | POA: Diagnosis not present

## 2020-09-04 DIAGNOSIS — E039 Hypothyroidism, unspecified: Secondary | ICD-10-CM | POA: Diagnosis not present

## 2020-09-04 DIAGNOSIS — R7301 Impaired fasting glucose: Secondary | ICD-10-CM

## 2020-09-04 DIAGNOSIS — Z0000 Encounter for general adult medical examination without abnormal findings: Secondary | ICD-10-CM | POA: Diagnosis not present

## 2020-09-04 MED ORDER — DICLOFENAC SODIUM 75 MG PO TBEC: 75.0000 mg | DELAYED_RELEASE_TABLET | Freq: Two times a day (BID) | ORAL | 0 refills | Status: DC

## 2020-09-04 NOTE — Patient Instructions (Addendum)
Please return in 6 months for hypertension follow up.  I will release your lab results to you on your MyChart account with further instructions. Please reply with any questions.   See the handout on the ankle stretches. Take the antiinflammatory twice a day for two weeks and ice your heel 2-3x/day.   If you have any questions or concerns, please don't hesitate to send me a message via MyChart or call the office at [phone]. Thank you for visiting with [place] today! It's our pleasure caring for you.

## 2020-09-04 NOTE — Progress Notes (Signed)
Subjective  Chief Complaint  Patient presents with  . Annual Exam    fasting  . Foot Pain    bilateral heel pain  . Health Maintenance    has not received COVID vaccine, declined flu shot - getting at the end of the month with family    HPI: Rodney Pineda is a 69 y.o. male who presents to [facility] today for a Male Wellness Visit. He also has the concerns and/or needs as listed above in the chief complaint. These will be addressed in addition to the Health Maintenance Visit.   Wellness Visit: annual visit with health maintenance review and exam    HM: 69 year old doing well.  Feels well.  Happier.  Tolerating all medications.  Refuses Covid vaccination.  Will get flu shot at the end of this month.  Other immunizations up-to-date. Lifestyle: Body mass index is 34.73 kg/m. Wt Readings from Last 3 Encounters:  09/04/20 249 lb (112.9 kg)  12/20/19 243 lb 6.4 oz (110.4 kg)  05/25/19 241 lb 9.6 oz (109.6 kg)    Chronic disease management visit and/or acute problem visit:  Hypertension, whitecoat response: Continues to check blood pressures at home and they are normal.  Tolerates medications.  Uses Lasix intermittently for lower extremity edema.  No lightheadedness.  Average reading 130/80.  No chest pain or shortness of breath.  Hypothyroidism which has been well controlled for many years.  Continues to be compliant with medications.  Energy levels are normal.  No symptoms of high or low thyroid.  Hyperlipidemia on a statin.  Elevated triglycerides as well.  Fasting for lab work today.  Tolerating statin.  History of impaired fasting glucose: Has gained some weight.  Denies symptoms of hyperglycemia.  Due for recheck  History of colon polyps with last colonoscopy 2019.  Complains of bilateral heel pain.  Chronic and intermittent problem.  Has orthotics but does not find them completely helpful.  Pain is worse after prolonged sitting.  First few steps  hurt.  Pain is in the back of the heel.  No injuries.  After he gets walking things are better.  No red hot swollen joints.  Has used turmeric and has stretched occasionally.  Patient Active Problem List   Diagnosis Date Noted  . Basal cell carcinoma (BCC) of skin of left upper extremity including shoulder 03/12/2017  . Colon polyps 05/01/2015  . Family history of colon cancer 01/31/2014  . IFG (impaired fasting glucose) 05/07/2011  . Acquired hypothyroidism 01/28/2011  . Benign essential hypertension 01/28/2011  . Mixed hyperlipidemia 01/28/2011  . Osteoarthritis, 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  . Allergic rhinitis 07/11/2010  . BMI 30.0-30.9,adult 04/17/2010   Health Maintenance  Topic Date Due  . COVID-19 Vaccine (1) 09/20/2020 (Originally 01/14/1963)  . INFLUENZA VACCINE  02/22/2021 (Originally 06/25/2020)  . COLONOSCOPY  05/26/2023  . TETANUS/TDAP  05/24/2029  . Hepatitis C Screening  Completed  . PNA vac Low Risk Adult  Completed   Immunization History  Administered Date(s) Administered  . Influenza Whole 09/09/2019  . Influenza, High Dose Seasonal PF 09/14/2018  . Pneumococcal Conjugate-13 03/16/2018  . Pneumococcal Polysaccharide-23 05/25/2019  . Tdap 08/14/2009, 05/25/2019  . Zoster 01/31/2014   We updated and reviewed the patient's past history in detail and it is documented below. Allergies: Patient is allergic to hydrochlorothiazide and niacin and related. Past Medical History  has a past medical history of Disease of thyroid gland, Family history of colon cancer, Hyperlipidemia, Hypertension, and Snake envenomation. Past  Surgical History Patient  has a past surgical history that includes Appendectomy (1995) and Cholecystectomy (2010). Social History Patient  reports that he has never smoked. He has never used smokeless tobacco. He reports that he does not drink alcohol and does not use drugs. Family History family history includes Cancer in his maternal grandmother; Colon cancer  in his maternal aunt and mother; Hyperlipidemia in his father; Hypertension in his brother, father, sister, and son; Lung cancer in his maternal aunt. Review of Systems: Constitutional: negative for fever or malaise Ophthalmic: negative for photophobia, double vision or loss of vision Cardiovascular: negative for chest pain, dyspnea on exertion, or new LE swelling Respiratory: negative for SOB or persistent cough Gastrointestinal: negative for abdominal pain, change in bowel habits or melena Genitourinary: negative for dysuria or gross hematuria Musculoskeletal: negative for new gait disturbance or muscular weakness Integumentary: negative for new or persistent rashes Neurological: negative for TIA or stroke symptoms Psychiatric: negative for SI or delusions Allergic/Immunologic: negative for hives  Patient Care Team    Relationship Specialty Notifications Start End  Leamon Arnt, MD PCP - General Family Medicine  03/01/13   Juanita Craver, MD Consulting Physician Gastroenterology  03/16/18    Objective  Vitals: BP (!) 182/102   Pulse 63   Temp 98.2 F (36.8 C) (Temporal)   Resp 16   Ht 5\' 11"  (1.803 m)   Wt 249 lb (112.9 kg)   SpO2 98%   BMI 34.73 kg/m  General:  Well developed, well nourished, no acute distress  Psych:  Alert and orientedx3,normal mood and affect HEENT:  Normocephalic, atraumatic, non-icteric sclera, PERRL, oropharynx is clear without mass or exudate, supple neck without adenopathy, mass or thyromegaly Cardiovascular:  Normal S1, S2, RRR without gallop, rub or murmur, nondisplaced PMI, +2 distal pulses in bilateral upper and lower extremities. Respiratory:  Good breath sounds bilaterally, CTAB with normal respiratory effort Gastrointestinal: normal bowel sounds, soft, non-tender, no noted masses. No HSM MSK: no deformities, contusions. Joints are without erythema or swelling. Spine and CVA region are nontender, minimal medial Achilles distal tendon tenderness left  greater than right. Heel pain.  Normal gait Skin:  Warm, no rashes or suspicious lesions noted Neurologic:    Mental status is normal. CN 2-11 are normal. Gross motor and sensory exams are normal. Stable gait. No tremor GU: No inguinal hernias or adenopathy are appreciated bilaterally   Assessment  1. Annual physical exam   2. Acquired hypothyroidism   3. Benign essential hypertension   4. IFG (impaired fasting glucose)   5. Mixed hyperlipidemia   6. Polyp of colon, unspecified part of colon, unspecified type   7. Achilles tendonosis      Plan  Male Wellness Visit:  Age appropriate Health Maintenance and Prevention measures were discussed with patient. Included topics are cancer screening recommendations, ways to keep healthy (see AVS) including dietary and exercise recommendations, regular eye and dental care, use of seat belts, and avoidance of moderate alcohol use and tobacco use.   BMI: discussed patient's BMI and encouraged positive lifestyle modifications to help get to or maintain a target BMI.  HM needs and immunizations were addressed and ordered. See below for orders. See HM and immunization section for updates.  Flu shot from [facility] later this month.  Declines Covid vaccination.  Education given.  Routine labs and screening tests ordered including cmp, cbc and lipids where appropriate.  Discussed recommendations regarding Vit D and calcium supplementation (see AVS)  Chronic disease f/u and/or  acute problem visit: (deemed necessary to be done in addition to the wellness visit):  Hypertension: Well-controlled by home readings.  Whitecoat response here in the office.  Continue current medications.  Check renal electrolytes  Hyperlipidemia hypothyroidism:-Well-controlled.  Recheck lab work on medications today.  Recheck A1c.  Recommend diet and weight loss.  Up-to-date for colon cancer screenings.  Achilles tendinopathy: Plus minus bursitis: Ice, stretching and change  course of diclofenac.  Follow-up if not improving  Follow up: Return in about 6 months (around 03/05/2021) for follow up Hypertension.   Commons side effects, risks, benefits, and alternatives for medications and treatment plan prescribed today were discussed, and the patient expressed understanding of the given instructions. Patient is instructed to call or message via MyChart if he/she has any questions or concerns regarding our treatment plan. No barriers to understanding were identified. We discussed Red Flag symptoms and signs in detail. Patient expressed understanding regarding what to do in case of urgent or emergency type symptoms.   Medication list was reconciled, printed and provided to the patient in AVS. Patient instructions and summary information was reviewed with the patient as documented in the AVS. This note was prepared with assistance of Dragon voice recognition software. Occasional wrong-word or sound-a-like substitutions may have occurred due to the inherent limitations of voice recognition software  This visit occurred during the SARS-CoV-2 public health emergency.  Safety protocols were in place, including screening questions prior to the visit, additional usage of staff PPE, and extensive cleaning of exam room while observing appropriate contact time as indicated for disinfecting solutions.   Orders Placed This Encounter  Procedures  . CBC with Differential/Platelet  . COMPLETE METABOLIC PANEL WITH GFR  . Lipid panel  . PSA  . TSH  . Hemoglobin A1c   No orders of the defined types were placed in this encounter.

## 2020-09-05 LAB — CBC WITH DIFFERENTIAL/PLATELET
Absolute Monocytes: 455 cells/uL (ref 200–950)
Basophils Absolute: 48 cells/uL (ref 0–200)
Basophils Relative: 0.7 %
Eosinophils Absolute: 242 cells/uL (ref 15–500)
Eosinophils Relative: 3.5 %
HCT: 45.1 % (ref 38.5–50.0)
Hemoglobin: 15 g/dL (ref 13.2–17.1)
Lymphs Abs: 2450 cells/uL (ref 850–3900)
MCH: 29.4 pg (ref 27.0–33.0)
MCHC: 33.3 g/dL (ref 32.0–36.0)
MCV: 88.3 fL (ref 80.0–100.0)
MPV: 10.9 fL (ref 7.5–12.5)
Monocytes Relative: 6.6 %
Neutro Abs: 3705 cells/uL (ref 1500–7800)
Neutrophils Relative %: 53.7 %
Platelets: 183 10*3/uL (ref 140–400)
RBC: 5.11 10*6/uL (ref 4.20–5.80)
RDW: 12.3 % (ref 11.0–15.0)
Total Lymphocyte: 35.5 %
WBC: 6.9 10*3/uL (ref 3.8–10.8)

## 2020-09-05 LAB — PSA: PSA: 0.21 ng/mL (ref <=4.0)

## 2020-09-05 LAB — TSH: TSH: 2.88 mIU/L (ref 0.40–4.50)

## 2020-09-05 LAB — COMPLETE METABOLIC PANEL WITH GFR
AG Ratio: 1.8 (calc) (ref 1.0–2.5)
ALT: 18 U/L (ref 9–46)
AST: 17 U/L (ref 10–35)
Albumin: 4.2 g/dL (ref 3.6–5.1)
Alkaline phosphatase (APISO): 52 U/L (ref 35–144)
BUN: 16 mg/dL (ref 7–25)
CO2: 28 mmol/L (ref 20–32)
Calcium: 9.4 mg/dL (ref 8.6–10.3)
Chloride: 104 mmol/L (ref 98–110)
Creat: 1 mg/dL (ref 0.70–1.25)
GFR, Est African American: 90 mL/min/{1.73_m2} (ref >=60)
GFR, Est Non African American: 78 mL/min/{1.73_m2} (ref >=60)
Globulin: 2.4 g/dL (calc) (ref 1.9–3.7)
Glucose, Bld: 114 mg/dL — ABNORMAL HIGH (ref 65–99)
Potassium: 4.5 mmol/L (ref 3.5–5.3)
Sodium: 137 mmol/L (ref 135–146)
Total Bilirubin: 0.7 mg/dL (ref 0.2–1.2)
Total Protein: 6.6 g/dL (ref 6.1–8.1)

## 2020-09-05 LAB — LIPID PANEL
Cholesterol: 164 mg/dL (ref <200)
HDL: 46 mg/dL (ref >=40)
LDL Cholesterol (Calc): 94 mg/dL (calc)
Non-HDL Cholesterol (Calc): 118 mg/dL (calc) (ref <130)
Total CHOL/HDL Ratio: 3.6 (calc) (ref <5.0)
Triglycerides: 140 mg/dL (ref <150)

## 2020-09-05 LAB — HEMOGLOBIN A1C
Hgb A1c MFr Bld: 5.6 % of total Hgb (ref <5.7)
Mean Plasma Glucose: 114 (calc)
eAG (mmol/L): 6.3 (calc)

## 2020-09-28 ENCOUNTER — Encounter: Payer: Self-pay | Admitting: Family Medicine

## 2020-11-29 ENCOUNTER — Other Ambulatory Visit: Payer: Self-pay | Admitting: Family Medicine

## 2021-02-28 ENCOUNTER — Ambulatory Visit (INDEPENDENT_AMBULATORY_CARE_PROVIDER_SITE_OTHER): Payer: Medicare HMO | Admitting: Family Medicine

## 2021-02-28 ENCOUNTER — Encounter: Payer: Self-pay | Admitting: Family Medicine

## 2021-02-28 ENCOUNTER — Other Ambulatory Visit: Payer: Self-pay

## 2021-02-28 VITALS — BP 132/78 | HR 68 | Temp 97.6°F | Ht 71.0 in | Wt 244.8 lb

## 2021-02-28 DIAGNOSIS — R609 Edema, unspecified: Secondary | ICD-10-CM

## 2021-02-28 DIAGNOSIS — R7301 Impaired fasting glucose: Secondary | ICD-10-CM

## 2021-02-28 DIAGNOSIS — Z6830 Body mass index (BMI) 30.0-30.9, adult: Secondary | ICD-10-CM | POA: Diagnosis not present

## 2021-02-28 DIAGNOSIS — E039 Hypothyroidism, unspecified: Secondary | ICD-10-CM | POA: Diagnosis not present

## 2021-02-28 DIAGNOSIS — Z7185 Encounter for immunization safety counseling: Secondary | ICD-10-CM | POA: Diagnosis not present

## 2021-02-28 DIAGNOSIS — I10 Essential (primary) hypertension: Secondary | ICD-10-CM | POA: Diagnosis not present

## 2021-02-28 MED ORDER — SHINGRIX 50 MCG/0.5ML IM SUSR: 0.5000 mL | Freq: Once | INTRAMUSCULAR | 0 refills | Status: AC

## 2021-02-28 MED ORDER — ROSUVASTATIN CALCIUM 20 MG PO TABS: 20.0000 mg | ORAL_TABLET | Freq: Every evening | ORAL | 3 refills | Status: DC

## 2021-02-28 NOTE — Progress Notes (Signed)
Subjective  CC:  Chief Complaint  Patient presents with  . Hypertension  . Health Maintenance    Needing script for shingrix vaccination     HPI: Rodney Pineda is a 70 y.o. male who presents to the office today to address the problems listed above in the chief complaint.  Hypertension f/u: Control is good . Pt reports he is doing well. taking medications as instructed, no medication side effects noted, no TIAs, no chest pain on exertion, no dyspnea on exertion, no swelling of ankles.  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  He understands he has a stress-induced hypertensive response at times.  Denies adverse effects from his BP medications. Compliance with medication is good.   Impaired fasting glucose with obesity: He has wife eating better.  Trying to lose weight.  Eating less fats and sugars.  No symptoms of hypoglycemia.  Hypothyroidism on levothyroxine daily.  No symptoms of high or low thyroid.  Last checked in October and was at goal.  No extremity edema, dependent: Doing very well.  Tolerates low-dose amlodipine 5 mg daily.  Rarely needing Lasix for swelling.  He has not taken in a couple months.    Assessment  1. White coat syndrome with diagnosis of hypertension   2. Vaccine counseling   3. Acquired hypothyroidism   4. BMI 30.0-30.9,adult   5. Dependent edema   6. IFG (impaired fasting glucose)      Plan    Hypertension f/u: BP control is well controlled.  Continue amlodipine 5 mg daily, metoprolol 100 mg daily, lisinopril 40 mg daily.  Hyperlipidemia f/u: Mild low intensity statin with LDL 94 at last check.  Recommend increasing to moderate intensity statin given risk factors.  Change to Crestor 20 mg nightly.  Recheck lipids at next visit.  Education given.  Impaired fasting glucose stable with dietary management.  Obesity: Encouraged to lose weight.  Hypothyroidism clinically well controlled.  No extremity  edema, controlled  Shingrix vaccine counseling given.  Prescription given.  Patient to get at the pharmacy. Education regarding management of these chronic disease states was given. Management strategies discussed on successive visits include dietary and exercise recommendations, goals of achieving and maintaining IBW, and lifestyle modifications aiming for adequate sleep and minimizing stressors.   Follow up: Return in about 6 months (around 08/30/2021) for complete physical, follow up Hypertension.  No orders of the defined types were placed in this encounter.  Meds ordered this encounter  Medications  . Zoster Vaccine Adjuvanted [facility]) injection    Sig: Inject 0.5 mLs into the muscle once for 1 dose. Please give 2nd dose 2-6 months after first dose    Dispense:  2 each    Refill:  0  . rosuvastatin (CRESTOR) 20 MG tablet    Sig: Take 1 tablet (20 mg total) by mouth at bedtime.    Dispense:  90 tablet    Refill:  3      BP Readings from Last 3 Encounters:  02/28/21 132/78  09/04/20 (!) 182/102  12/20/19 136/82   Wt Readings from Last 3 Encounters:  02/28/21 244 lb 12.8 oz (111 kg)  09/04/20 249 lb (112.9 kg)  12/20/19 243 lb 6.4 oz (110.4 kg)    Lab Results  Component Value Date   CHOL 164 09/04/2020   CHOL 134 05/25/2019   CHOL 160 03/16/2018   Lab Results  Component Value Date   HDL 46 09/04/2020  HDL 39.20 05/25/2019   HDL 40.20 03/16/2018   Lab Results  Component Value Date   LDLCALC 94 09/04/2020   LDLCALC 62 05/25/2019   LDLCALC  09/06/2009    64        Total Cholesterol/HDL:CHD Risk Coronary Heart Disease Risk Table                     Men   Women  1/2 Average Risk   3.4   3.3  Average Risk       5.0   4.4  2 X Average Risk   9.6   7.1  3 X Average Risk  23.4   11.0        Use the calculated Patient Ratio above and the CHD Risk Table to determine the patient's CHD Risk.        ATP III CLASSIFICATION (LDL):  <100     mg/dL   Optimal   100-129  mg/dL   Near or Above                    Optimal  130-159  mg/dL   Borderline  160-189  mg/dL   High  >190     mg/dL   Very High   Lab Results  Component Value Date   TRIG 140 09/04/2020   TRIG 163.0 (H) 05/25/2019   TRIG 211.0 (H) 03/16/2018   Lab Results  Component Value Date   CHOLHDL 3.6 09/04/2020   CHOLHDL 3 05/25/2019   CHOLHDL 4 03/16/2018   Lab Results  Component Value Date   LDLDIRECT 84.0 03/16/2018   Lab Results  Component Value Date   CREATININE 1.00 09/04/2020   BUN 16 09/04/2020   NA 137 09/04/2020   K 4.5 09/04/2020   CL 104 09/04/2020   CO2 28 09/04/2020    The 10-year ASCVD risk score Mikey Bussing DC Jr., et al., 2013) is: 17.9%   Values used to calculate the score:     Age: 55 years     Sex: Male     Is Non-Hispanic African American: No     Diabetic: No     Tobacco smoker: No     Systolic Blood Pressure: 308 mmHg     Is BP treated: Yes     HDL Cholesterol: 46 mg/dL     Total Cholesterol: 164 mg/dL  I reviewed the patients updated PMH, FH, and SocHx.    Patient Active Problem List   Diagnosis Date Noted  . Dependent edema 02/28/2021  . Basal cell carcinoma (BCC) of skin of left upper extremity including shoulder 03/12/2017  . Colon polyps 05/01/2015  . Family history of colon cancer 01/31/2014  . IFG (impaired fasting glucose) 05/07/2011  . Acquired hypothyroidism 01/28/2011  . White coat syndrome with diagnosis of hypertension 01/28/2011  . Mixed hyperlipidemia 01/28/2011  . Osteoarthritis, 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  . Allergic rhinitis 07/11/2010  . BMI 30.0-30.9,adult 04/17/2010    Allergies: Hydrochlorothiazide and Niacin and related  Social History: Patient  reports that he has never smoked. He has never used smokeless tobacco. He reports that he does not drink alcohol and does not use drugs.  Current Meds  Medication Sig  . amLODipine (NORVASC) 5 MG tablet TAKE 1 TABLET BY MOUTH DAILY  . diclofenac sodium (VOLTAREN) 1 % GEL Apply 2  g topically 4 (four) times daily as needed.  . fish oil-omega-3 fatty acids 1000 MG capsule Take 1 g by mouth 2 (two) times  daily.  . Flaxseed, Linseed, 1000 MG CAPS Take 1,000 mg by mouth 2 (two) times daily.  . furosemide (LASIX) 20 MG tablet Take 1 tablet (20 mg total) by mouth daily as needed.  Marland Kitchen GARLIC PO Take 1 tablet by mouth daily.  Marland Kitchen levothyroxine (SYNTHROID) 137 MCG tablet TAKE 1 TABLET BY MOUTH EVERY DAY  . lisinopril (ZESTRIL) 40 MG tablet TAKE 1 TABLET BY MOUTH EVERY DAY  . metoprolol succinate (TOPROL-XL) 100 MG 24 hr tablet TAKE 1 TABLET BY MOUTH EVERY DAY  . rosuvastatin (CRESTOR) 20 MG tablet Take 1 tablet (20 mg total) by mouth at bedtime.  Marland Kitchen Zoster Vaccine Adjuvanted [facility]) injection Inject 0.5 mLs into the muscle once for 1 dose. Please give 2nd dose 2-6 months after first dose  . [DISCONTINUED] simvastatin (ZOCOR) 40 MG tablet TAKE 1 TABLET BY MOUTH EVERY MORNING    Review of Systems: Cardiovascular: negative for chest pain, palpitations, leg swelling, orthopnea Respiratory: negative for SOB, wheezing or persistent cough Gastrointestinal: negative for abdominal pain Genitourinary: negative for dysuria or gross hematuria  Objective  Vitals: BP 132/78   Pulse 68   Temp 97.6 F (36.4 C) (Temporal)   Ht 5\' 11"  (1.803 m)   Wt 244 lb 12.8 oz (111 kg)   SpO2 97%   BMI 34.14 kg/m  General: no acute distress  Psych:  Alert and oriented, normal mood and affect HEENT:  Normocephalic, atraumatic, supple neck  Cardiovascular:  RRR without murmur. no edema Respiratory:  Good breath sounds bilaterally, CTAB with normal respiratory effort   Commons side effects, risks, benefits, and alternatives for medications and treatment plan prescribed today were discussed, and the patient expressed understanding of the given instructions. Patient is instructed to call or message via MyChart if he/she has any questions or concerns regarding our treatment plan. No barriers to  understanding were identified. We discussed Red Flag symptoms and signs in detail. Patient expressed understanding regarding what to do in case of urgent or emergency type symptoms.   Medication list was reconciled, printed and provided to the patient in AVS. Patient instructions and summary information was reviewed with the patient as documented in the AVS. This note was prepared with assistance of Dragon voice recognition software. Occasional wrong-word or sound-a-like substitutions may have occurred due to the inherent limitations of voice recognition software  This visit occurred during the SARS-CoV-2 public health emergency.  Safety protocols were in place, including screening questions prior to the visit, additional usage of staff PPE, and extensive cleaning of exam room while observing appropriate contact time as indicated for disinfecting solutions.

## 2021-02-28 NOTE — Patient Instructions (Signed)
Please return in 6 months for your annual complete physical; please come fasting.  Please take the rosuvastatin instead of the simvastatin. This should push your LDL or lousy cholesterol to a lower level which will be beneficial to help prevent cardiovascular disease in the future.   Please take the prescription for Shingrix to the pharmacy so they may administer the vaccinations. Your insurance will then cover the injections.   If you have any questions or concerns, please don't hesitate to send me a message via MyChart or call the office at [phone]. Thank you for visiting with [place] today! It's our pleasure caring for you.

## 2021-03-28 ENCOUNTER — Telehealth: Payer: Self-pay

## 2021-03-28 NOTE — Telephone Encounter (Signed)
Patient has an appointment tomorrow 

## 2021-03-28 NOTE — Telephone Encounter (Signed)
FYI

## 2021-03-28 NOTE — Telephone Encounter (Signed)
To be triaged .... needs OV

## 2021-03-28 NOTE — Telephone Encounter (Signed)
Nurse Assessment Nurse: D'Heur Lucia Gaskins, RN, Adrienne Date/Time (Eastern Time): 03/28/2021 9:40:46 AM Confirm and document reason for call. If symptomatic, describe symptoms. ---Caller states he has flank pain bilaterally, groin and lower abdominal pain and urination urgency for past two weeks. Does the patient have any new or worsening symptoms? ---Yes Will a triage be completed? ---Yes Related visit to physician within the last 2 weeks? ---No Does the PT have any chronic conditions? (i.e. diabetes, asthma, this includes High risk factors for pregnancy, etc.) ---Yes List chronic conditions. ---HTN, HX of kidney stone. Is this a behavioral health or substance abuse call? ---No Guidelines Guideline Title Affirmed Question Affirmed Notes Nurse Date/Time Eilene Ghazi Time) Urinary Symptoms Side (flank) or lower back pain present [place], RN, Adrienne 03/28/2021 9:46:03 AM Disp. Time Eilene Ghazi Time) Disposition Final User 03/28/2021 9:50:02 AM See PCP within 24 Hours Yes D'Heur Lucia Gaskins, RN, Vincente Liberty PLEASE NOTE: All timestamps contained within this report are represented as [place] Standard Time. CONFIDENTIALTY NOTICE: This fax transmission is intended only for the addressee. It contains information that is legally privileged, confidential or otherwise protected from use or disclosure. If you are not the intended recipient, you are strictly prohibited from reviewing, disclosing, copying using or disseminating any of this information or taking any action in reliance on or regarding this information. If you have received this fax in error, please notify us immediately by telephone so that we can arrange for its return to [place]. Phone: [phone], Toll-Free: [phone], Fax: [phone] Page: 2 of 2 Call Id: [id number] Rew Disagree/Comply Comply Caller Understands Yes PreDisposition Call Doctor Care Advice Given Per Guideline SEE PCP WITHIN 24 HOURS: PAIN MEDICINES: * For pain relief, you  can take either acetaminophen, ibuprofen, or naproxen. CALL BACK IF: * Fever occurs * Unable to urinate and bladder feels full * You become worse CARE ADVICE given per Urinary Symptoms (Adult) guideline. Comments User: Vincente Liberty, D'Heur Lucia Gaskins, RN Date/Time Eilene Ghazi Time): 03/28/2021 9:44:31 AM Pain is mild and intermittent. User: Vincente Liberty, D'Heur Lucia Gaskins, RN Date/Time Eilene Ghazi Time): 03/28/2021 9:47:59 AM Temp is 98.5 forehead. User: Vincente Liberty, D'Heur Lucia Gaskins, RN Date/Time Eilene Ghazi Time): 03/28/2021 9:49:06 AM User: Vincente Liberty, D'Heur Lucia Gaskins, RN Date/Time Eilene Ghazi Time): 03/28/2021 9:50:01 AM Caller has an appointment tomorrow. Referrals REFERRED TO PCP OFFIC

## 2021-03-29 ENCOUNTER — Ambulatory Visit (INDEPENDENT_AMBULATORY_CARE_PROVIDER_SITE_OTHER): Payer: Medicare HMO | Admitting: Family Medicine

## 2021-03-29 ENCOUNTER — Encounter: Payer: Self-pay | Admitting: Family Medicine

## 2021-03-29 ENCOUNTER — Other Ambulatory Visit: Payer: Self-pay

## 2021-03-29 VITALS — BP 138/88 | HR 60 | Temp 98.3°F | Ht 71.0 in | Wt 241.0 lb

## 2021-03-29 DIAGNOSIS — K5792 Diverticulitis of intestine, part unspecified, without perforation or abscess without bleeding: Secondary | ICD-10-CM

## 2021-03-29 DIAGNOSIS — M5441 Lumbago with sciatica, right side: Secondary | ICD-10-CM | POA: Diagnosis not present

## 2021-03-29 DIAGNOSIS — R7301 Impaired fasting glucose: Secondary | ICD-10-CM | POA: Diagnosis not present

## 2021-03-29 DIAGNOSIS — R109 Unspecified abdominal pain: Secondary | ICD-10-CM | POA: Diagnosis not present

## 2021-03-29 LAB — POCT URINALYSIS DIPSTICK
Bilirubin, UA: NEGATIVE
Blood, UA: NEGATIVE
Glucose, UA: NEGATIVE
Ketones, UA: NEGATIVE
Leukocytes, UA: NEGATIVE
Nitrite, UA: NEGATIVE
Protein, UA: NEGATIVE
Spec Grav, UA: 1.015 (ref 1.010–1.025)
Urobilinogen, UA: 0.2 E.U./dL
pH, UA: 6 (ref 5.0–8.0)

## 2021-03-29 LAB — COMPREHENSIVE METABOLIC PANEL
ALT: 16 U/L (ref 0–53)
AST: 18 U/L (ref 0–37)
Albumin: 4.2 g/dL (ref 3.5–5.2)
Alkaline Phosphatase: 55 U/L (ref 39–117)
BUN: 18 mg/dL (ref 6–23)
CO2: 26 mEq/L (ref 19–32)
Calcium: 9.4 mg/dL (ref 8.4–10.5)
Chloride: 105 mEq/L (ref 96–112)
Creatinine, Ser: 1.02 mg/dL (ref 0.40–1.50)
GFR: 75.68 mL/min (ref >60.00)
Glucose, Bld: 113 mg/dL — ABNORMAL HIGH (ref 70–99)
Potassium: 4.4 mEq/L (ref 3.5–5.1)
Sodium: 138 mEq/L (ref 135–145)
Total Bilirubin: 1.1 mg/dL (ref 0.2–1.2)
Total Protein: 6.9 g/dL (ref 6.0–8.3)

## 2021-03-29 LAB — CBC WITH DIFFERENTIAL/PLATELET
Basophils Absolute: 0 10*3/uL (ref 0.0–0.1)
Basophils Relative: 0.7 % (ref 0.0–3.0)
Eosinophils Absolute: 0.2 10*3/uL (ref 0.0–0.7)
Eosinophils Relative: 3.2 % (ref 0.0–5.0)
HCT: 43.6 % (ref 39.0–52.0)
Hemoglobin: 14.7 g/dL (ref 13.0–17.0)
Lymphocytes Relative: 36.6 % (ref 12.0–46.0)
Lymphs Abs: 2.1 10*3/uL (ref 0.7–4.0)
MCHC: 33.6 g/dL (ref 30.0–36.0)
MCV: 87.9 fl (ref 78.0–100.0)
Monocytes Absolute: 0.4 10*3/uL (ref 0.1–1.0)
Monocytes Relative: 7.5 % (ref 3.0–12.0)
Neutro Abs: 3 10*3/uL (ref 1.4–7.7)
Neutrophils Relative %: 52 % (ref 43.0–77.0)
Platelets: 171 10*3/uL (ref 150.0–400.0)
RBC: 4.96 Mil/uL (ref 4.22–5.81)
RDW: 12.9 % (ref 11.5–15.5)
WBC: 5.8 10*3/uL (ref 4.0–10.5)

## 2021-03-29 LAB — HEMOGLOBIN A1C: Hgb A1c MFr Bld: 6 % (ref 4.6–6.5)

## 2021-03-29 MED ORDER — METRONIDAZOLE 500 MG PO TABS: 500.0000 mg | ORAL_TABLET | Freq: Two times a day (BID) | ORAL | 0 refills | Status: AC

## 2021-03-29 MED ORDER — CIPROFLOXACIN HCL 500 MG PO TABS: 500.0000 mg | ORAL_TABLET | Freq: Two times a day (BID) | ORAL | 0 refills | Status: AC

## 2021-03-29 NOTE — Patient Instructions (Signed)
Please follow up as scheduled for your next visit with me: 08/30/2021   I will treat you for diverticulitis with antibiotics. This should help alleviate the abdominal discomfort over the next 3-7 days. If anything worsens, let me know.  I think your back pain is coming from your back; return if you need more help managing this. Stretching exercises listed below can be helpful. Tylenol or advil can be used as well.   Your PSA was normal in October. I do not believe your symptoms are coming from your prostate.   If you have any questions or concerns, please don't hesitate to send me a message via MyChart or call the office at [phone]. Thank you for visiting with [place] today! It's our pleasure caring for you.

## 2021-03-29 NOTE — Progress Notes (Signed)
Subjective  CC:  Chief Complaint  Patient presents with  . Flank Pain    Pt c/o bilateral flank pain x 1 week. Denies urinary symptoms. Has not taken any medication.  . Abdominal Pain    Pt c/o right lower quadrant abdominal pain since this morning radiating into right groin. Pt has been having some constipation. Pt took 2 Dulcolax on Monday, 1 on Tuesday. Moved bowels this morning very loose x 2.    HPI: Rodney Pineda is a 70 y.o. male who presents to the office today to address the problems listed above in the chief complaint.  70 year old male with 1 to 2-week history of lower abdominal soreness and bilateral back pain with associated right thigh numbness intermittently.  Patient reports he noted the back pain first.  He has history of low back pain.  Positional in nature.  Sharp pains with certain position changes, especially rolling over in bed.  He has noticed some numbness in the anterior thigh.  He denies weakness or bowel or bladder incontinence.  Pain is mild and he has not taken anything for it.  However over the last week he is also developed some lower abdominal soreness or discomfort.  Mild in nature.  Has noticed some decreased frequency of bowel movements, typically going to 3 times per day but lately only going once.  He denies constipation, hard to pass small or hard stools.  He did take a laxative which moved his bowels.  He denies blood in stool.  He denies fevers or chills.  No change in appetite.  No upper GI symptoms.  He does have a history of diverticulitis.  He worries about his prostate.   Assessment  1. Acute diverticulitis   2. Bilateral flank pain   3. Acute bilateral low back pain with right-sided sciatica   4. IFG (impaired fasting glucose)      Plan   Lower abdominal pain: Working diagnosis is acute diverticulitis, mild.  Check lab work and treat with Cipro and Flagyl.  Monitor.  Return if worsening symptoms.  Rule out urinary problems with urine culture.   Push fluids.  Tylenol if needed.  Bilateral low back pain: Most likely related to DJD and/or radicular pain.  Tylenol or Advil, stretching exercises and follow-up if worsens.  Urine dipstick was normal today in the office.  Normal prostate exam in the office today.  Reassured.  History of impaired fasting glucose due for A1c.  He denies symptoms of hyperglycemia.  Follow up: As scheduled, sooner symptoms worsen 08/30/2021  Orders Placed This Encounter  Procedures  . CBC with Differential/Platelet  . Comprehensive metabolic panel  . Hemoglobin A1c  . POCT urinalysis dipstick   Meds ordered this encounter  Medications  . ciprofloxacin (CIPRO) 500 MG tablet    Sig: Take 1 tablet (500 mg total) by mouth 2 (two) times daily for 7 days.    Dispense:  14 tablet    Refill:  0  . metroNIDAZOLE (FLAGYL) 500 MG tablet    Sig: Take 1 tablet (500 mg total) by mouth 2 (two) times daily for 7 days.    Dispense:  14 tablet    Refill:  0      I reviewed the patients updated PMH, FH, and SocHx.    Patient Active Problem List   Diagnosis Date Noted  . Dependent edema 02/28/2021  . Basal cell carcinoma (BCC) of skin of left upper extremity including shoulder 03/12/2017  . Colon polyps 05/01/2015  .  Family history of colon cancer 01/31/2014  . IFG (impaired fasting glucose) 05/07/2011  . Acquired hypothyroidism 01/28/2011  . White coat syndrome with diagnosis of hypertension 01/28/2011  . Mixed hyperlipidemia 01/28/2011  . Osteoarthritis, 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  . Allergic rhinitis 07/11/2010  . BMI 30.0-30.9,adult 04/17/2010   Current Meds  Medication Sig  . amLODipine (NORVASC) 5 MG tablet TAKE 1 TABLET BY MOUTH DAILY  . B Complex Vitamins (B COMPLEX-B12) TABS   . ciprofloxacin (CIPRO) 500 MG tablet Take 1 tablet (500 mg total) by mouth 2 (two) times daily for 7 days.  . diclofenac sodium (VOLTAREN) 1 % GEL Apply 2 g topically 4 (four) times daily as needed.  . fish oil-omega-3 fatty acids  1000 MG capsule Take 1 g by mouth 2 (two) times daily.  . Flaxseed, Linseed, 1000 MG CAPS Take 1,000 mg by mouth 2 (two) times daily.  . furosemide (LASIX) 20 MG tablet Take 1 tablet (20 mg total) by mouth daily as needed.  Marland Kitchen GARLIC PO Take 1 tablet by mouth daily.  Marland Kitchen levothyroxine (SYNTHROID) 137 MCG tablet TAKE 1 TABLET BY MOUTH EVERY DAY  . lisinopril (ZESTRIL) 40 MG tablet TAKE 1 TABLET BY MOUTH EVERY DAY  . metoprolol succinate (TOPROL-XL) 100 MG 24 hr tablet TAKE 1 TABLET BY MOUTH EVERY DAY  . metroNIDAZOLE (FLAGYL) 500 MG tablet Take 1 tablet (500 mg total) by mouth 2 (two) times daily for 7 days.  . Misc Natural Products (BLACK CHERRY CONCENTRATE PO)   . rosuvastatin (CRESTOR) 20 MG tablet Take 1 tablet (20 mg total) by mouth at bedtime.    Allergies: Patient is allergic to hydrochlorothiazide and niacin and related. Family History: Patient family history includes Cancer in his maternal grandmother; Colon cancer in his maternal aunt and mother; Hyperlipidemia in his father; Hypertension in his brother, father, sister, and son; Lung cancer in his maternal aunt. Social History:  Patient  reports that he has never smoked. He has never used smokeless tobacco. He reports that he does not drink alcohol and does not use drugs.  Review of Systems: Constitutional: Negative for fever malaise or anorexia Cardiovascular: negative for chest pain Respiratory: negative for SOB or persistent cough Gastrointestinal: negative for abdominal pain  Objective  Vitals: BP 138/88 (BP Location: Left Arm, Patient Position: Sitting, Cuff Size: Large)   Pulse 60   Temp 98.3 F (36.8 C) (Temporal)   Ht 5\' 11"  (1.803 m)   Wt 241 lb (109.3 kg)   SpO2 97%   BMI 33.61 kg/m  General: no acute distress , A&Ox3, appears well moves easily to exam table HEENT: PEERL, conjunctiva normal, neck is supple Cardiovascular:  RRR without murmur or gallop.  Respiratory:  Good breath sounds bilaterally, CTAB with  normal respiratory effort Gastrointestinal: soft, flat abdomen, normal active bowel sounds, no palpable masses, no hepatosplenomegaly, no appreciated hernias, mild left greater than right tenderness without rebound or guarding, no suprapubic tenderness, no CVA tenderness GU: Distal prostate is not enlarged, no tenderness.  Cannot feel entire prostate. Skin:  Warm, no rashes  Office Visit on 03/29/2021  Component Date Value Ref Range Status  . Color, UA 03/29/2021 yellow   Final  . Clarity, UA 03/29/2021 clear   Final  . Glucose, UA 03/29/2021 Negative  Negative Final  . Bilirubin, UA 03/29/2021 negative   Final  . Ketones, UA 03/29/2021 negative   Final  . Spec Grav, UA 03/29/2021 1.015  1.010 - 1.025 Final  . Blood, UA 03/29/2021 negative  Final  . pH, UA 03/29/2021 6.0  5.0 - 8.0 Final  . Protein, UA 03/29/2021 Negative  Negative Final  . Urobilinogen, UA 03/29/2021 0.2  0.2 or 1.0 E.U./dL Final  . Nitrite, UA 03/29/2021 negative   Final  . Leukocytes, UA 03/29/2021 Negative  Negative Final     Commons side effects, risks, benefits, and alternatives for medications and treatment plan prescribed today were discussed, and the patient expressed understanding of the given instructions. Patient is instructed to call or message via MyChart if he/she has any questions or concerns regarding our treatment plan. No barriers to understanding were identified. We discussed Red Flag symptoms and signs in detail. Patient expressed understanding regarding what to do in case of urgent or emergency type symptoms.   Medication list was reconciled, printed and provided to the patient in AVS. Patient instructions and summary information was reviewed with the patient as documented in the AVS. This note was prepared with assistance of Dragon voice recognition software. Occasional wrong-word or sound-a-like substitutions may have occurred due to the inherent limitations of voice recognition software  This  visit occurred during the SARS-CoV-2 public health emergency.  Safety protocols were in place, including screening questions prior to the visit, additional usage of staff PPE, and extensive cleaning of exam room while observing appropriate contact time as indicated for disinfecting solutions.

## 2021-07-17 ENCOUNTER — Other Ambulatory Visit: Payer: Self-pay | Admitting: Family Medicine

## 2021-07-17 MED ORDER — SHINGRIX 50 MCG/0.5ML IM SUSR: 0.5000 mL | Freq: Once | INTRAMUSCULAR | 0 refills | Status: AC

## 2021-08-24 ENCOUNTER — Ambulatory Visit (INDEPENDENT_AMBULATORY_CARE_PROVIDER_SITE_OTHER): Payer: Medicare HMO

## 2021-08-24 ENCOUNTER — Other Ambulatory Visit: Payer: Self-pay

## 2021-08-24 DIAGNOSIS — Z0000 Encounter for general adult medical examination without abnormal findings: Secondary | ICD-10-CM | POA: Diagnosis not present

## 2021-08-24 NOTE — Progress Notes (Addendum)
Virtual Visit via Telephone Note  I connected with  Billye Nydam on 08/24/21 at 11:45 AM EDT by telephone and verified that I am speaking with the correct person using two identifiers.  Medicare Annual Wellness visit completed telephonically due to Covid-19 pandemic.   Persons participating in this call: This Health Coach and this patient.   Location: Patient: Home Provider: Office   I discussed the limitations, risks, security and privacy concerns of performing an evaluation and management service by telephone and the availability of in person appointments. The patient expressed understanding and agreed to proceed.  Unable to perform video visit due to video visit attempted and failed and/or patient does not have video capability.   Some vital signs may be absent or patient reported.   Willette Brace, LPN   Subjective:   Parley Pidcock is a 70 y.o. male who presents for an Initial Medicare Annual Wellness Visit.  Review of Systems     Cardiac Risk Factors include: advanced age (>48men, >2 women);hypertension;dyslipidemia;male gender;obesity (BMI >30kg/m2)     Objective:    There were no vitals filed for this visit. There is no height or weight on file to calculate BMI.  Advanced Directives 08/24/2021  Does Patient Have a Medical Advance Directive? Yes  Does patient want to make changes to medical advance directive? Yes (MAU/Ambulatory/Procedural Areas - Information given)    Current Medications (verified) Outpatient Encounter Medications as of 08/24/2021  Medication Sig   amLODipine (NORVASC) 5 MG tablet TAKE 1 TABLET BY MOUTH DAILY   Ascorbic Acid (VITAMIN C) 1000 MG tablet    B Complex Vitamins (B COMPLEX-B12) TABS    Cholecalciferol (D3 2000) 50 MCG (2000 UT) CAPS    Coenzyme Q10 300 MG CAPS    diclofenac sodium (VOLTAREN) 1 % GEL Apply 2 g topically 4 (four) times daily as needed.   fish oil-omega-3 fatty acids 1000 MG capsule Take 1 g by mouth 2 (two) times  daily.   Flaxseed, Linseed, (FLAX SEEDS PO) Take by mouth. With omega 3   furosemide (LASIX) 20 MG tablet Take 1 tablet (20 mg total) by mouth daily as needed.   GARLIC PO Take 1 tablet by mouth daily.   levothyroxine (SYNTHROID) 137 MCG tablet TAKE 1 TABLET BY MOUTH EVERY DAY   lisinopril (ZESTRIL) 40 MG tablet TAKE 1 TABLET BY MOUTH EVERY DAY   Magnesium 250 MG TABS    metoprolol succinate (TOPROL-XL) 100 MG 24 hr tablet TAKE 1 TABLET BY MOUTH EVERY DAY   Misc Natural Products (BLACK CHERRY CONCENTRATE PO)    Red Yeast Rice Extract (RED YEAST RICE PO) Take by mouth.   rosuvastatin (CRESTOR) 20 MG tablet Take 1 tablet (20 mg total) by mouth at bedtime.   Zinc 50 MG TABS    [DISCONTINUED] Flaxseed, Linseed, 1000 MG CAPS Take 1,000 mg by mouth 2 (two) times daily.   No facility-administered encounter medications on file as of 08/24/2021.    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   Past Surgical History:  Procedure Laterality Date   APPENDECTOMY  1995   CHOLECYSTECTOMY  2010   Family History  Problem Relation Age of Onset   Colon cancer Mother  Hypertension Father    Hyperlipidemia Father    Hypertension Sister    Hypertension Brother    Cancer Maternal Grandmother        unknown type   Colon cancer Maternal Aunt    Lung cancer Maternal Aunt    Hypertension Son    Social History   Socioeconomic History   Marital status: Married    Spouse name: Not on file   Number of children: Not on file   Years of education: Not on file   Highest education level: Not on file  Occupational History   Not on file  Tobacco Use   Smoking status: Never   Smokeless tobacco: Never  Vaping Use   Vaping Use: Never used  Substance  and Sexual Activity   Alcohol use: No   Drug use: No   Sexual activity: Yes  Other Topics Concern   Not on file  Social History Narrative   Not on file   Social Determinants of Health   Financial Resource Strain: Low Risk    Difficulty of Paying Living Expenses: Not hard at all  Food Insecurity: No Food Insecurity   Worried About Charity fundraiser in the Last Year: Never true   [place] in the Last Year: Never true  Transportation Needs: No Transportation Needs   Lack of Transportation (Medical): No   Lack of Transportation (Non-Medical): No  Physical Activity: Inactive   Days of Exercise per Week: 0 days   Minutes of Exercise per Session: 0 min  Stress: No Stress Concern Present   Feeling of Stress : Not at all  Social Connections: Moderately Integrated   Frequency of Communication with Friends and Family: More than three times a week   Frequency of Social Gatherings with Friends and Family: More than three times a week   Attends Religious Services: 1 to 4 times per year   Active Member of [employer] or Organizations: No   Attends Music therapist: Never   Marital Status: Married    Tobacco Counseling Counseling given: Not Answered   Clinical Intake:  Pre-visit preparation completed: Yes  Pain : No/denies pain     BMI - recorded: 33.63 Nutritional Status: BMI > 30  Obese Nutritional Risks: None Diabetes: No  How often do you need to have someone help you when you read instructions, pamphlets, or other written materials from your doctor or pharmacy?: 1 - Never  Diabetic?no  Interpreter Needed?: No  Information entered by :: Charlott Rakes, LPN   Activities of Daily Living In your present state of health, do you have any difficulty performing the following activities: 08/24/2021 09/04/2020  Hearing? N N  Vision? N N  Difficulty concentrating or making decisions? N N  Walking or climbing stairs? N N  Dressing or bathing? N N  Doing  errands, shopping? N N  Preparing Food and eating ? N -  Using the Toilet? N -  In the past six months, have you accidently leaked urine? N -  Do you have problems with loss of bowel control? N -  Managing your Medications? N -  Managing your Finances? N -  Housekeeping or managing your Housekeeping? N -  Some recent data might be hidden    Patient Care Team: Leamon Arnt, MD as PCP - General (Family Medicine) Juanita Craver, MD as Consulting Physician (Gastroenterology)  Indicate any recent Medical Services you may have received from other than [facility] providers in the past year (  date may be approximate).     Assessment:   This is a routine wellness examination for Jori Moll.  Hearing/Vision screen Hearing Screening - Comments:: Pt denies any hearing  Vision Screening - Comments:: Pt follows up with Dr Satira Sark for annual eye exams   Dietary issues and exercise activities discussed: Current Exercise Habits: The patient does not participate in regular exercise at present (works around the house)   Goals Addressed             This [place]    Patient Stated       Staying healthy and eat right        Depression Screen PHQ 2/9 Scores 08/24/2021 09/04/2020 05/25/2019 02/12/2018  PHQ - 2 Score 0 0 0 0    Fall Risk Fall Risk  08/24/2021 05/25/2019 02/12/2018  Falls in the past year? 0 0 No  Number falls in past yr: 0 0 -  Injury with Fall? 0 0 -  Risk for fall due to : Impaired vision - -  Follow up Falls prevention discussed Falls evaluation completed -    FALL RISK PREVENTION PERTAINING TO THE HOME:  Any stairs in or around the home? Yes  If so, are there any without handrails? No  Home free of loose throw rugs in walkways, pet beds, electrical cords, etc? Yes  Adequate lighting in your home to reduce risk of falls? Yes   ASSISTIVE DEVICES UTILIZED TO PREVENT FALLS:  Life alert? No  Use of a cane, walker or w/c? No  Grab bars in the bathroom? Yes  Shower chair  or bench in shower? No  Elevated toilet seat or a handicapped toilet? No   TIMED UP AND GO:  Was the test performed? No .   Cognitive Function:     6CIT Screen 08/24/2021  What Year? 0 points  What month? 0 points  What time? 0 points  Count back from 20 0 points  Months in reverse 0 points  Repeat phrase 0 points  Total Score 0    Immunizations Immunization History  Administered Date(s) Administered   Influenza Whole 09/09/2019   Influenza, High Dose Seasonal PF 09/14/2018   Pneumococcal Conjugate-13 03/16/2018   Pneumococcal Polysaccharide-23 05/25/2019   Tdap 08/14/2009, 05/25/2019   Zoster Recombinat (Shingrix) 08/21/2021   Zoster, Live 01/31/2014    TDAP status: Up to date  Flu Vaccine status: Due, Education has been provided regarding the importance of this vaccine. Advised may receive this vaccine at local pharmacy or Health Dept. Aware to provide a copy of the vaccination record if obtained from local pharmacy or Health Dept. Verbalized acceptance and understanding.  Pneumococcal vaccine status: Up to date  Covid-19 vaccine status: Declined, Education has been provided regarding the importance of this vaccine but patient still declined. Advised may receive this vaccine at local pharmacy or Health Dept.or vaccine clinic. Aware to provide a copy of the vaccination record if obtained from local pharmacy or Health Dept. Verbalized acceptance and understanding.  Qualifies for Shingles Vaccine? Yes   Zostavax completed Yes   Shingrix Completed?: No.    Education has been provided regarding the importance of this vaccine. Patient has been advised to call insurance company to determine out of pocket expense if they have not yet received this vaccine. Advised may also receive vaccine at local pharmacy or Health Dept. Verbalized acceptance and understanding.  Screening Tests Health Maintenance  Topic Date Due   INFLUENZA VACCINE  06/25/2021   COVID-19 Vaccine (1)  09/09/2021 (Originally 07/14/1951)   Zoster Vaccines- Shingrix (2 of 2) 10/16/2021   COLONOSCOPY (Pts 45-39yrs Insurance coverage will need to be confirmed)  05/26/2023   TETANUS/TDAP  05/24/2029   Hepatitis C Screening  Completed   HPV VACCINES  Aged Out    Health Maintenance  Health Maintenance Due  Topic Date Due   INFLUENZA VACCINE  06/25/2021    Colorectal cancer screening: Type of screening: Colonoscopy. Completed 05/25/18. Repeat every 5 years   Additional Screening:  Hepatitis C Screening: completed 03/14/16  Vision Screening: Recommended annual ophthalmology exams for early detection of glaucoma and other disorders of the eye. Is the patient up to date with their annual eye exam?  Yes  Who is the provider or what is the name of the office in which the patient attends annual eye exams? Dr Satira Sark  If pt is not established with a provider, would they like to be referred to a provider to establish care? No .   Dental Screening: Recommended annual dental exams for proper oral hygiene  Community Resource Referral / Chronic Care Management: CRR required this visit?  No   CCM required this visit?  No      Plan:     I have personally reviewed and noted the following in the patient's chart:   Medical and social history Use of alcohol, tobacco or illicit drugs  Current medications and supplements including opioid prescriptions. Patient is not currently taking opioid prescriptions. Functional ability and status Nutritional status Physical activity Advanced directives List of other physicians Hospitalizations, surgeries, and ER visits in previous 12 months Vitals Screenings to include cognitive, depression, and falls Referrals and appointments  In addition, I have reviewed and discussed with patient certain preventive protocols, quality metrics, and best practice recommendations. A written personalized care plan for preventive services as well as general preventive  health recommendations were provided to patient.     Willette Brace, LPN   [id number]   Nurse Notes: None

## 2021-08-24 NOTE — Patient Instructions (Addendum)
[name] , Thank you for taking time to come for your Medicare Wellness Visit. I appreciate your ongoing commitment to your health goals. Please review the following plan we discussed and let me know if I can assist you in the future.   Screening recommendations/referrals: Colonoscopy: Done 05/25/18 repeat every 5 years next due 05/26/23 Recommended yearly ophthalmology/optometry visit for glaucoma screening and checkup Recommended yearly dental visit for hygiene and checkup  Vaccinations: Influenza vaccine: Due and discussed  Pneumococcal vaccine: Completed  Tdap vaccine: Done 05/25/19 repeat every 10 years due 05/24/29 Shingles vaccine: 1st dose 08/21/21 Covid-19: Declined and discussed  Advanced directives: Advance directive discussed with you today. I have provided a copy for you to complete at home and have notarized. Once this is complete please bring a copy in to our office so we can scan it into your chart.  Conditions/risks identified: Stay healthy and eat right   Next appointment: Follow up in one year for your annual wellness visit.   Preventive Care 70 Years and Older, Male Preventive care refers to lifestyle choices and visits with your health care provider that can promote health and wellness. What does preventive care include? A yearly physical exam. This is also called an annual well check. Dental exams once or twice a year. Routine eye exams. Ask your health care provider how often you should have your eyes checked. Personal lifestyle choices, including: Daily care of your teeth and gums. Regular physical activity. Eating a healthy diet. Avoiding tobacco and drug use. Limiting alcohol use. Practicing safe sex. Taking low doses of aspirin every day. Taking vitamin and mineral supplements as recommended by your health care provider. What happens during an annual well check? The services and screenings done by your health care provider during your annual well check will  depend on your age, overall health, lifestyle risk factors, and family history of disease. Counseling  Your health care provider may ask you questions about your: Alcohol use. Tobacco use. Drug use. Emotional well-being. Home and relationship well-being. Sexual activity. Eating habits. History of falls. Memory and ability to understand (cognition). Work and work Statistician. Screening  You may have the following tests or measurements: Height, weight, and BMI. Blood pressure. Lipid and cholesterol levels. These may be checked every 5 years, or more frequently if you are over 86 years old. Skin check. Lung cancer screening. You may have this screening every year starting at age 59 if you have a 30-pack-year history of smoking and currently smoke or have quit within the past 15 years. Fecal occult blood test (FOBT) of the stool. You may have this test every year starting at age 61. Flexible sigmoidoscopy or colonoscopy. You may have a sigmoidoscopy every 5 years or a colonoscopy every 10 years starting at age 30. Prostate cancer screening. Recommendations will vary depending on your family history and other risks. Hepatitis C blood test. Hepatitis B blood test. Sexually transmitted disease (STD) testing. Diabetes screening. This is done by checking your blood sugar (glucose) after you have not eaten for a while (fasting). You may have this done every 1-3 years. Abdominal aortic aneurysm (AAA) screening. You may need this if you are a current or former smoker. Osteoporosis. You may be screened starting at age 66 if you are at high risk. Talk with your health care provider about your test results, treatment options, and if necessary, the need for more tests. Vaccines  Your health care provider may recommend certain vaccines, such as: Influenza vaccine. This  is recommended every year. Tetanus, diphtheria, and acellular pertussis (Tdap, Td) vaccine. You may need a Td booster every 10  years. Zoster vaccine. You may need this after age 74. Pneumococcal 13-valent conjugate (PCV13) vaccine. One dose is recommended after age 77. Pneumococcal polysaccharide (PPSV23) vaccine. One dose is recommended after age 34. Talk to your health care provider about which screenings and vaccines you need and how often you need them. This information is not intended to replace advice given to you by your health care provider. Make sure you discuss any questions you have with your health care provider. Document Released: 12/08/2015 Document Revised: 07/31/2016 Document Reviewed: 09/12/2015 Elsevier Interactive Patient Education  2017 [place] Prevention in the Home Falls can cause injuries. They can happen to people of all ages. There are many things you can do to make your home safe and to help prevent falls. What can I do on the outside of my home? Regularly fix the edges of walkways and driveways and fix any cracks. Remove anything that might make you trip as you walk through a door, such as a raised step or threshold. Trim any bushes or trees on the path to your home. Use bright outdoor lighting. Clear any walking paths of anything that might make someone trip, such as rocks or tools. Regularly check to see if handrails are loose or broken. Make sure that both sides of any steps have handrails. Any raised decks and porches should have guardrails on the edges. Have any leaves, snow, or ice cleared regularly. Use sand or salt on walking paths during winter. Clean up any spills in your garage right away. This includes oil or grease spills. What can I do in the bathroom? Use night lights. Install grab bars by the toilet and in the tub and shower. Do not use towel bars as grab bars. Use non-skid mats or decals in the tub or shower. If you need to sit down in the shower, use a plastic, non-slip stool. Keep the floor dry. Clean up any water that spills on the floor as soon as it  happens. Remove soap buildup in the tub or shower regularly. Attach bath mats securely with double-sided non-slip rug tape. Do not have throw rugs and other things on the floor that can make you trip. What can I do in the bedroom? Use night lights. Make sure that you have a light by your bed that is easy to reach. Do not use any sheets or blankets that are too big for your bed. They should not hang down onto the floor. Have a firm chair that has side arms. You can use this for support while you get dressed. Do not have throw rugs and other things on the floor that can make you trip. What can I do in the kitchen? Clean up any spills right away. Avoid walking on wet floors. Keep items that you use a lot in easy-to-reach places. If you need to reach something above you, use a strong step stool that has a grab bar. Keep electrical cords out of the way. Do not use floor polish or wax that makes floors slippery. If you must use wax, use non-skid floor wax. Do not have throw rugs and other things on the floor that can make you trip. What can I do with my stairs? Do not leave any items on the stairs. Make sure that there are handrails on both sides of the stairs and use them. Fix handrails that  are broken or loose. Make sure that handrails are as long as the stairways. Check any carpeting to make sure that it is firmly attached to the stairs. Fix any carpet that is loose or worn. Avoid having throw rugs at the top or bottom of the stairs. If you do have throw rugs, attach them to the floor with carpet tape. Make sure that you have a light switch at the top of the stairs and the bottom of the stairs. If you do not have them, ask someone to add them for you. What else can I do to help prevent falls? Wear shoes that: Do not have high heels. Have rubber bottoms. Are comfortable and fit you well. Are closed at the toe. Do not wear sandals. If you use a stepladder: Make sure that it is fully opened.  Do not climb a closed stepladder. Make sure that both sides of the stepladder are locked into place. Ask someone to hold it for you, if possible. Clearly mark and make sure that you can see: Any grab bars or handrails. First and last steps. Where the edge of each step is. Use tools that help you move around (mobility aids) if they are needed. These include: Canes. Walkers. Scooters. Crutches. Turn on the lights when you go into a dark area. Replace any light bulbs as soon as they burn out. Set up your furniture so you have a clear path. Avoid moving your furniture around. If any of your floors are uneven, fix them. If there are any pets around you, be aware of where they are. Review your medicines with your doctor. Some medicines can make you feel dizzy. This can increase your chance of falling. Ask your doctor what other things that you can do to help prevent falls. This information is not intended to replace advice given to you by your health care provider. Make sure you discuss any questions you have with your health care provider. Document Released: 09/07/2009 Document Revised: 04/18/2016 Document Reviewed: 12/16/2014 Elsevier Interactive Patient Education  2017 [employer].

## 2021-08-30 ENCOUNTER — Ambulatory Visit (INDEPENDENT_AMBULATORY_CARE_PROVIDER_SITE_OTHER): Payer: Medicare HMO | Admitting: Family Medicine

## 2021-08-30 ENCOUNTER — Other Ambulatory Visit: Payer: Self-pay

## 2021-08-30 ENCOUNTER — Encounter: Payer: Self-pay | Admitting: Family Medicine

## 2021-08-30 VITALS — BP 130/80 | HR 58 | Temp 98.2°F | Ht 71.0 in | Wt 234.6 lb

## 2021-08-30 DIAGNOSIS — E039 Hypothyroidism, unspecified: Secondary | ICD-10-CM | POA: Diagnosis not present

## 2021-08-30 DIAGNOSIS — R609 Edema, unspecified: Secondary | ICD-10-CM | POA: Diagnosis not present

## 2021-08-30 DIAGNOSIS — Z0000 Encounter for general adult medical examination without abnormal findings: Secondary | ICD-10-CM | POA: Diagnosis not present

## 2021-08-30 DIAGNOSIS — I10 Essential (primary) hypertension: Secondary | ICD-10-CM

## 2021-08-30 DIAGNOSIS — E782 Mixed hyperlipidemia: Secondary | ICD-10-CM

## 2021-08-30 DIAGNOSIS — R7301 Impaired fasting glucose: Secondary | ICD-10-CM | POA: Diagnosis not present

## 2021-08-30 LAB — COMPREHENSIVE METABOLIC PANEL
ALT: 23 U/L (ref 0–53)
AST: 23 U/L (ref 0–37)
Albumin: 4.2 g/dL (ref 3.5–5.2)
Alkaline Phosphatase: 51 U/L (ref 39–117)
BUN: 20 mg/dL (ref 6–23)
CO2: 30 mEq/L (ref 19–32)
Calcium: 9.7 mg/dL (ref 8.4–10.5)
Chloride: 104 mEq/L (ref 96–112)
Creatinine, Ser: 1.04 mg/dL (ref 0.40–1.50)
GFR: 73.72 mL/min (ref >60.00)
Glucose, Bld: 102 mg/dL — ABNORMAL HIGH (ref 70–99)
Potassium: 4.6 mEq/L (ref 3.5–5.1)
Sodium: 139 mEq/L (ref 135–145)
Total Bilirubin: 0.8 mg/dL (ref 0.2–1.2)
Total Protein: 6.8 g/dL (ref 6.0–8.3)

## 2021-08-30 LAB — CBC WITH DIFFERENTIAL/PLATELET
Basophils Absolute: 0 10*3/uL (ref 0.0–0.1)
Basophils Relative: 0.7 % (ref 0.0–3.0)
Eosinophils Absolute: 0.2 10*3/uL (ref 0.0–0.7)
Eosinophils Relative: 3.3 % (ref 0.0–5.0)
HCT: 42.5 % (ref 39.0–52.0)
Hemoglobin: 14 g/dL (ref 13.0–17.0)
Lymphocytes Relative: 41.4 % (ref 12.0–46.0)
Lymphs Abs: 2.5 10*3/uL (ref 0.7–4.0)
MCHC: 33 g/dL (ref 30.0–36.0)
MCV: 88.8 fl (ref 78.0–100.0)
Monocytes Absolute: 0.5 10*3/uL (ref 0.1–1.0)
Monocytes Relative: 8.4 % (ref 3.0–12.0)
Neutro Abs: 2.8 10*3/uL (ref 1.4–7.7)
Neutrophils Relative %: 46.2 % (ref 43.0–77.0)
Platelets: 197 10*3/uL (ref 150.0–400.0)
RBC: 4.79 Mil/uL (ref 4.22–5.81)
RDW: 13.3 % (ref 11.5–15.5)
WBC: 6 10*3/uL (ref 4.0–10.5)

## 2021-08-30 LAB — LIPID PANEL
Cholesterol: 156 mg/dL (ref 0–200)
HDL: 43.6 mg/dL (ref >39.00)
LDL Cholesterol: 75 mg/dL (ref 0–99)
NonHDL: 112.28
Total CHOL/HDL Ratio: 4
Triglycerides: 185 mg/dL — ABNORMAL HIGH (ref 0.0–149.0)
VLDL: 37 mg/dL (ref 0.0–40.0)

## 2021-08-30 LAB — TSH: TSH: 1.87 u[IU]/mL (ref 0.35–5.50)

## 2021-08-30 LAB — HEMOGLOBIN A1C: Hgb A1c MFr Bld: 6 % (ref 4.6–6.5)

## 2021-08-30 NOTE — Progress Notes (Signed)
Subjective  Chief Complaint  Patient presents with   Annual Exam   Hypertension   Hyperlipidemia    HPI: Rodney Pineda is a 70 y.o. male who presents to [facility] [place] today for a Male Wellness Visit. He also has the concerns and/or needs as listed above in the chief complaint. These will be addressed in addition to the Health Maintenance Visit.   Wellness Visit: annual visit with health maintenance review and exam   HM: screens are up to date. All imms up to date; flu from [facility]. Due 2nd shingrix next month. Did well with first. Working in yard a lot. No other formal exercise. Maintaining weight loss. Eating better  Body mass index is 32.72 kg/m. Wt Readings from Last 3 Encounters:  08/30/21 234 lb 9.6 oz (106.4 kg)  03/29/21 241 lb (109.3 kg)  02/28/21 244 lb 12.8 oz (111 kg)     Chronic disease management visit and/or acute problem visit: HTN: normotensive in office again today. Compliant with meds and no Aes. HLD on crestor. Tolerating well. No further foot pain, ? Related to simvastatin. No myalgias. Fasting today Low thyroid which has been well controlled w/o need for dose adjustment ... feels fine. Due for recheck.  No problems with edema now that he is eating better and has lost weight IFG: no sxs of hyperglycemia.   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   Allergic rhinitis 07/11/2010   BMI 30.0-30.9,adult 04/17/2010   Health Maintenance  Topic Date Due   COVID-19 Vaccine (1) 09/09/2021 (Originally 07/14/1951)   Zoster  Vaccines- Shingrix (2 of 2) 10/16/2021   COLONOSCOPY (Pts 45-1yrs Insurance coverage will need to be confirmed)  05/26/2023   TETANUS/TDAP  05/24/2029   INFLUENZA VACCINE  Completed   Hepatitis C Screening  Completed   HPV VACCINES  Aged Out   Immunization History  Administered Date(s) Administered   Fluad Quad(high Dose 65+) 08/29/2021   Influenza Whole 09/09/2019   Influenza, High Dose Seasonal PF 09/14/2018   Pneumococcal Conjugate-13 03/16/2018   Pneumococcal Polysaccharide-23 05/25/2019   Tdap 08/14/2009, 05/25/2019   Zoster Recombinat (Shingrix) 08/21/2021   Zoster, Live 01/31/2014   We updated and reviewed the patient's past history in detail and it is documented below. Allergies: Patient is allergic to hydrochlorothiazide and niacin and related. Past Medical History  has a past medical history of Disease of thyroid gland, Family history of colon cancer, Hyperlipidemia, Hypertension, and Snake envenomation. Past Surgical History Patient  has a past surgical history that includes Appendectomy (1995) and Cholecystectomy (2010). Social History Patient  reports that he has never smoked. He has never used smokeless tobacco. He reports that he does not drink alcohol and does not use drugs. Family History family history includes Cancer in his maternal grandmother; Colon cancer in his maternal aunt and mother; Hyperlipidemia in his father; Hypertension in his brother, father, sister, and son; Lung cancer in his maternal aunt. Review of Systems: Constitutional: negative for fever or malaise Ophthalmic: negative for photophobia, double vision or loss of vision Cardiovascular: negative for chest pain, dyspnea on exertion,  or new LE swelling Respiratory: negative for SOB or persistent cough Gastrointestinal: negative for abdominal pain, change in bowel habits or melena Genitourinary: negative for dysuria or gross hematuria Musculoskeletal: negative for new gait disturbance or muscular  weakness Integumentary: negative for new or persistent rashes Neurological: negative for TIA or stroke symptoms Psychiatric: negative for SI or delusions Allergic/Immunologic: negative for hives  Patient Care Team    Relationship Specialty Notifications Start End  Leamon Arnt, MD PCP - General Family Medicine  03/01/13   Juanita Craver, MD Consulting Physician Gastroenterology  03/16/18    Objective  Vitals: BP 130/80   Pulse (!) 58   Temp 98.2 F (36.8 C) (Temporal)   Ht 5\' 11"  (1.803 m)   Wt 234 lb 9.6 oz (106.4 kg)   SpO2 96%   BMI 32.72 kg/m  General:  Well developed, well nourished, no acute distress  Psych:  Alert and orientedx3,normal mood and affect, happy HEENT:  Normocephalic, atraumatic, non-icteric sclera, PERRL, supple neck without adenopathy, mass or thyromegaly Cardiovascular:  Normal S1, S2, RRR without gallop, rub or murmur, nondisplaced PMI, +2 distal pulses in bilateral upper and lower extremities. Respiratory:  Good breath sounds bilaterally, CTAB with normal respiratory effort Gastrointestinal: normal bowel sounds, soft, non-tender, no noted masses. No HSM MSK: no deformities, contusions. Joints are without erythema or swelling. Spine and CVA region are nontender Skin:  Warm, no rashes or suspicious lesions noted Neurologic:    Mental status is normal. Gross motor and sensory exams are normal. Stable gait. No tremor    Assessment  1. Annual physical exam   2. White coat syndrome with diagnosis of hypertension   3. IFG (impaired fasting glucose)   4. Acquired hypothyroidism   5. Mixed hyperlipidemia   6. Dependent edema      Plan  Male Wellness Visit: Age appropriate Health Maintenance and Prevention measures were discussed with patient. Included topics are cancer screening recommendations, ways to keep healthy (see AVS) including dietary and exercise recommendations, regular eye and dental care, use of seat belts, and avoidance of moderate alcohol use  and tobacco use.  BMI: discussed patient's BMI and encouraged positive lifestyle modifications to help get to or maintain a target BMI. HM needs and immunizations were addressed and ordered. See below for orders. See HM and immunization section for updates. Will return for 2nd shingrix Routine labs and screening tests ordered including cmp, cbc and lipids where appropriate. Discussed recommendations regarding Vit D and calcium supplementation (see AVS)  Chronic disease f/u and/or acute problem visit: (deemed necessary to be done in addition to the wellness visit): HTN: This medical condition is well controlled. There are no signs of complications, medication side effects, or red flags. Patient is instructed to continue the current treatment plan without change in therapies or medications. Continue amlodipine 5, toprol xl 100 and lisinopril 40 daily. Check renal function and electrolytes. HLD on crestor 20 and tolerating well. Recheck lipds and lfts Low thyroid: clinically euthyroid. Recheck and cont levothyroxine 165mcg daily IFG: diet controlled. Recheck to see if improved. Continue healthy diet Edema: resolved. Rare lasix if needed.   Follow up: 6 mo for bp recheck.  Commons side effects, risks, benefits, and alternatives for medications and treatment plan prescribed today were discussed, and the patient expressed understanding of the given instructions. Patient is instructed to call or message via MyChart if he/she has any questions or concerns regarding our treatment plan. No barriers to understanding were identified. We discussed Red Flag  symptoms and signs in detail. Patient expressed understanding regarding what to do in case of urgent or emergency type symptoms.  Medication list was reconciled, printed and provided to the patient in AVS. Patient instructions and summary information was reviewed with the patient as documented in the AVS. This note was prepared with assistance of Dragon voice  recognition software. Occasional wrong-word or sound-a-like substitutions may have occurred due to the inherent limitations of voice recognition software  This visit occurred during the SARS-CoV-2 public health emergency.  Safety protocols were in place, including screening questions prior to the visit, additional usage of staff PPE, and extensive cleaning of exam room while observing appropriate contact time as indicated for disinfecting solutions.   Orders Placed This Encounter  Procedures   CBC with Differential/Platelet   Comprehensive metabolic panel   Lipid panel   Hemoglobin A1c   TSH   No orders of the defined types were placed in this encounter.

## 2021-08-30 NOTE — Patient Instructions (Signed)
Please return in 6 months for hypertension follow up.   I will release your lab results to you on your MyChart account with further instructions. Please reply with any questions.    Always good seeing you! Glad you are well. Have a wonderful trip and holiday season.   If you have any questions or concerns, please don't hesitate to send me a message via MyChart or call the office at [phone]. Thank you for visiting with [place] today! It's our pleasure caring for you.

## 2021-09-19 DIAGNOSIS — H43813 Vitreous degeneration, bilateral: Secondary | ICD-10-CM | POA: Diagnosis not present

## 2021-09-19 DIAGNOSIS — R7303 Prediabetes: Secondary | ICD-10-CM | POA: Diagnosis not present

## 2021-09-19 DIAGNOSIS — H524 Presbyopia: Secondary | ICD-10-CM | POA: Diagnosis not present

## 2021-09-19 DIAGNOSIS — H25013 Cortical age-related cataract, bilateral: Secondary | ICD-10-CM | POA: Diagnosis not present

## 2021-09-19 LAB — HM DIABETES EYE EXAM

## 2021-09-20 ENCOUNTER — Encounter: Payer: Self-pay | Admitting: Family Medicine

## 2021-10-16 ENCOUNTER — Other Ambulatory Visit: Payer: Self-pay | Admitting: Family Medicine

## 2021-11-21 ENCOUNTER — Other Ambulatory Visit: Payer: Self-pay | Admitting: Family Medicine

## 2021-11-21 ENCOUNTER — Encounter: Payer: Self-pay | Admitting: Family Medicine

## 2021-11-23 MED ORDER — METOPROLOL SUCCINATE ER 100 MG PO TB24: 100.0000 mg | ORAL_TABLET | Freq: Every day | ORAL | 3 refills | Status: DC

## 2021-11-23 MED ORDER — ROSUVASTATIN CALCIUM 20 MG PO TABS: 20.0000 mg | ORAL_TABLET | Freq: Every evening | ORAL | 3 refills | Status: DC

## 2021-11-23 MED ORDER — LISINOPRIL 40 MG PO TABS: 40.0000 mg | ORAL_TABLET | Freq: Every day | ORAL | 3 refills | Status: DC

## 2021-11-23 MED ORDER — AMLODIPINE BESYLATE 5 MG PO TABS: 5.0000 mg | ORAL_TABLET | Freq: Every day | ORAL | 3 refills | Status: DC

## 2021-11-23 MED ORDER — FUROSEMIDE 20 MG PO TABS: 20.0000 mg | ORAL_TABLET | Freq: Every day | ORAL | 5 refills | Status: AC | PRN

## 2022-02-15 ENCOUNTER — Ambulatory Visit (INDEPENDENT_AMBULATORY_CARE_PROVIDER_SITE_OTHER): Payer: Medicare HMO | Admitting: Family Medicine

## 2022-02-15 ENCOUNTER — Other Ambulatory Visit: Payer: Self-pay

## 2022-02-15 ENCOUNTER — Encounter: Payer: Self-pay | Admitting: Family Medicine

## 2022-02-15 ENCOUNTER — Ambulatory Visit (INDEPENDENT_AMBULATORY_CARE_PROVIDER_SITE_OTHER)
Admission: RE | Admit: 2022-02-15 | Discharge: 2022-02-15 | Disposition: A | Discharge status: Home / Self Care | Payer: Medicare HMO | Source: Ambulatory Visit | Attending: Family Medicine | Admitting: Family Medicine

## 2022-02-15 VITALS — BP 138/77 | HR 65 | Temp 98.5°F | Ht 71.0 in | Wt 239.8 lb

## 2022-02-15 DIAGNOSIS — W57XXXA Bitten or stung by nonvenomous insect and other nonvenomous arthropods, initial encounter: Secondary | ICD-10-CM

## 2022-02-15 DIAGNOSIS — K5792 Diverticulitis of intestine, part unspecified, without perforation or abscess without bleeding: Secondary | ICD-10-CM | POA: Diagnosis not present

## 2022-02-15 DIAGNOSIS — M545 Low back pain, unspecified: Secondary | ICD-10-CM | POA: Diagnosis not present

## 2022-02-15 DIAGNOSIS — E782 Mixed hyperlipidemia: Secondary | ICD-10-CM | POA: Diagnosis not present

## 2022-02-15 DIAGNOSIS — Z130 Encounter for screening for diseases of the blood and blood-forming organs and certain disorders involving the immune mechanism: Secondary | ICD-10-CM | POA: Diagnosis not present

## 2022-02-15 DIAGNOSIS — I10 Essential (primary) hypertension: Secondary | ICD-10-CM | POA: Diagnosis not present

## 2022-02-15 DIAGNOSIS — R7301 Impaired fasting glucose: Secondary | ICD-10-CM | POA: Diagnosis not present

## 2022-02-15 LAB — CBC WITH DIFFERENTIAL/PLATELET
Basophils Absolute: 0 10*3/uL (ref 0.0–0.1)
Basophils Relative: 0.6 % (ref 0.0–3.0)
Eosinophils Absolute: 0.1 10*3/uL (ref 0.0–0.7)
Eosinophils Relative: 1.5 % (ref 0.0–5.0)
HCT: 42.6 % (ref 39.0–52.0)
Hemoglobin: 14.1 g/dL (ref 13.0–17.0)
Lymphocytes Relative: 34.5 % (ref 12.0–46.0)
Lymphs Abs: 2.7 10*3/uL (ref 0.7–4.0)
MCHC: 33.2 g/dL (ref 30.0–36.0)
MCV: 87.4 fl (ref 78.0–100.0)
Monocytes Absolute: 0.6 10*3/uL (ref 0.1–1.0)
Monocytes Relative: 7.2 % (ref 3.0–12.0)
Neutro Abs: 4.4 10*3/uL (ref 1.4–7.7)
Neutrophils Relative %: 56.2 % (ref 43.0–77.0)
Platelets: 158 10*3/uL (ref 150.0–400.0)
RBC: 4.88 Mil/uL (ref 4.22–5.81)
RDW: 12.9 % (ref 11.5–15.5)
WBC: 7.9 10*3/uL (ref 4.0–10.5)

## 2022-02-15 LAB — COMPREHENSIVE METABOLIC PANEL WITH GFR
ALT: 15 U/L (ref 0–53)
AST: 18 U/L (ref 0–37)
Albumin: 4.1 g/dL (ref 3.5–5.2)
Alkaline Phosphatase: 55 U/L (ref 39–117)
BUN: 17 mg/dL (ref 6–23)
CO2: 30 meq/L (ref 19–32)
Calcium: 9.5 mg/dL (ref 8.4–10.5)
Chloride: 105 meq/L (ref 96–112)
Creatinine, Ser: 0.99 mg/dL (ref 0.40–1.50)
GFR: 77.96 mL/min
Glucose, Bld: 113 mg/dL — ABNORMAL HIGH (ref 70–99)
Potassium: 4.1 meq/L (ref 3.5–5.1)
Sodium: 140 meq/L (ref 135–145)
Total Bilirubin: 1.3 mg/dL — ABNORMAL HIGH (ref 0.2–1.2)
Total Protein: 6.7 g/dL (ref 6.0–8.3)

## 2022-02-15 LAB — SEDIMENTATION RATE: Sed Rate: 37 mm/hr — ABNORMAL HIGH (ref 0–20)

## 2022-02-15 MED ORDER — DICLOFENAC SODIUM 75 MG PO TBEC
75.0000 mg | DELAYED_RELEASE_TABLET | Freq: Two times a day (BID) | ORAL | 1 refills | Status: DC | PRN
Start: 2022-02-15 — End: 2022-09-02

## 2022-02-15 MED ORDER — METRONIDAZOLE 500 MG PO TABS: 500.0000 mg | ORAL_TABLET | Freq: Two times a day (BID) | ORAL | 0 refills | Status: AC

## 2022-02-15 NOTE — Patient Instructions (Signed)
Please return in October for your complete physical.  ? ?Please go to our [facility] office to get your xrays done. You can walk in M-F between 8:30am- noon or 1pm - 5pm. Tell them you are there for xrays ordered by me. They will send me the results, then I will let you know the results with instructions.  ? ?Address: [address].  The Xray department is located in the basement.  ? ?Add the flagyl antibiotic to your doxycycline to cover for diverticulitis. I think this is the problem.  ? ?Use tylenol and/or voltaren for your back pain. See below for back exercises to start.  ? ?If you have any questions or concerns, please don't hesitate to send me a message via MyChart or call the office at [phone]. Thank you for visiting with [place] today! It's our pleasure caring for you.  ? ?Back Exercises ?The following exercises strengthen the muscles that help to support the trunk (torso) and back. They also help to keep the lower back flexible. Doing these exercises can help to prevent or lessen existing low back pain. ?If you have back pain or discomfort, try doing these exercises 2-3 times each day or as told by your health care provider. ?As your pain improves, do them once each day, but increase the number of times that you repeat the steps for each exercise (do more repetitions). ?To prevent the recurrence of back pain, continue to do these exercises once each day or as told by your health care provider. ?Do exercises exactly as told by your health care provider and adjust them as directed. It is normal to feel mild stretching, pulling, tightness, or discomfort as you do these exercises, but you should stop right away if you feel sudden pain or your pain gets worse. ?Exercises ?Single knee to chest ?Repeat these steps 3-5 times for each leg: ?Lie on your back on a firm bed or the floor with your legs extended. ?Bring one knee to your chest. Your other leg should stay extended and in contact with the  floor. ?Hold your knee in place by grabbing your knee or thigh with both hands and hold. ?Pull on your knee until you feel a gentle stretch in your lower back or buttocks. ?Hold the stretch for 10-30 seconds. ?Slowly release and straighten your leg. ? ?Pelvic tilt ?Repeat these steps 5-10 times: ?Lie on your back on a firm bed or the floor with your legs extended. ?Bend your knees so they are pointing toward the ceiling and your feet are flat on the floor. ?Tighten your lower abdominal muscles to press your lower back against the floor. This motion will tilt your pelvis so your tailbone points up toward the ceiling instead of pointing to your feet or the floor. ?With gentle tension and even breathing, hold this position for 5-10 seconds. ? ?Cat-cow ?Repeat these steps until your lower back becomes more flexible: ?Get into a hands-and-knees position on a firm bed or the floor. Keep your hands under your shoulders, and keep your knees under your hips. You may place padding under your knees for comfort. ?Let your head hang down toward your chest. Contract your abdominal muscles and point your tailbone toward the floor so your lower back becomes rounded like the back of a cat. ?Hold this position for 5 seconds. ?Slowly lift your head, let your abdominal muscles relax, and point your tailbone up toward the ceiling so your back forms a sagging arch like  the back of a cow. ?Hold this position for 5 seconds. ? ?Press-ups ?Repeat these steps 5-10 times: ?Lie on your abdomen (face-down) on a firm bed or the floor. ?Place your palms near your head, about shoulder-width apart. ?Keeping your back as relaxed as possible and keeping your hips on the floor, slowly straighten your arms to raise the top half of your body and lift your shoulders. Do not use your back muscles to raise your upper torso. You may adjust the placement of your hands to make yourself more comfortable. ?Hold this position for 5 seconds while you keep your  back relaxed. ?Slowly return to lying flat on the floor. ? ?Bridges ?Repeat these steps 10 times: ?Lie on your back on a firm bed or the floor. ?Bend your knees so they are pointing toward the ceiling and your feet are flat on the floor. Your arms should be flat at your sides, next to your body. ?Tighten your buttocks muscles and lift your buttocks off the floor until your waist is at almost the same height as your knees. You should feel the muscles working in your buttocks and the back of your thighs. If you do not feel these muscles, slide your feet 1-2 inches (2.5-5 cm) farther away from your buttocks. ?Hold this position for 3-5 seconds. ?Slowly lower your hips to the starting position, and allow your buttocks muscles to relax completely. ?If this exercise is too easy, try doing it with your arms crossed over your chest. ?Abdominal crunches ?Repeat these steps 5-10 times: ?Lie on your back on a firm bed or the floor with your legs extended. ?Bend your knees so they are pointing toward the ceiling and your feet are flat on the floor. ?Cross your arms over your chest. ?Tip your chin slightly toward your chest without bending your neck. ?Tighten your abdominal muscles and slowly raise your torso high enough to lift your shoulder blades a tiny bit off the floor. Avoid raising your torso higher than that because it can put too much stress on your lower back and does not help to strengthen your abdominal muscles. ?Slowly return to your starting position. ? ?Back lifts ?Repeat these steps 5-10 times: ?Lie on your abdomen (face-down) with your arms at your sides, and rest your forehead on the floor. ?Tighten the muscles in your legs and your buttocks. ?Slowly lift your chest off the floor while you keep your hips pressed to the floor. Keep the back of your head in line with the curve in your back. Your eyes should be looking at the floor. ?Hold this position for 3-5 seconds. ?Slowly return to your starting  position. ? ?Contact a health care provider if: ?Your back pain or discomfort gets much worse when you do an exercise. ?Your worsening back pain or discomfort does not lessen within 2 hours after you exercise. ?If you have any of these problems, stop doing these exercises right away. Do not do them again unless your health care provider says that you can. ?Get help right away if: ?You develop sudden, severe back pain. If this happens, stop doing the exercises right away. Do not do them again unless your health care provider says that you can. ?This information is not intended to replace advice given to you by your health care provider. Make sure you discuss any questions you have with your health care provider. ?Document Revised: 05/08/2021 Document Reviewed: 01/24/2021 ?Elsevier Patient Education ? [place]. ? ?

## 2022-02-15 NOTE — Progress Notes (Signed)
? ?Subjective  ?CC:  ?Chief Complaint  ?Patient presents with  ? Hypertension  ?  Pt here to f/U with Bp.   ? ? ?HPI: Rodney Pineda is a 71 y.o. male who presents to the office today to address the problems listed above in the chief complaint. ?Hypertension f/u: Control is good . Pt reports he is doing well. taking medications as instructed, no medication side effects noted, no TIAs, no chest pain on exertion, no dyspnea on exertion, no swelling of ankles. Home readings remain normal. Has white coat component. Tolerates meds. Diet is good. He denies adverse effects from his BP medications. Compliance with medication is good.  ?C/o fever to 100, lower abdominal pain and change in stool pattern w/ soft stool w/o blood or mucous. Had tick bite noted last week. Then 24 hours later, had these sxs. Sought care from NP at work... started doxy. Improving. No rash or arthralgias. Has h/o diverticulitis. Feels similar. No n/v or rectal bleeding.  ?HLD better on stating.  ?IFG: no sxs of hyperglycemia and improving diet.  ?C/o low back pain x months. Pain with extension. No injury. No radicular sxs. No b/b dysfunction. Not taking any medications.  ? ?Assessment  ?1. White coat syndrome with diagnosis of hypertension   ?2. Mixed hyperlipidemia   ?3. IFG (impaired fasting glucose)   ?4. Acute diverticulitis   ?5. Acute midline low back pain without sciatica   ?6. Tick bite, unspecified site, initial encounter   ? ?  ?Plan  ? ?Hypertension f/u: BP control is well controlled. No change in bp meds ?Hyperlipidemia f/u: at goal ?IFG: clinically stable. Continue healthy diet and recheck at next cpe ?Acute diverticulitis on doxy after tick bite: add flagyl. Check lab work.  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 Voltaren for pain.  ? ?Education regarding management of these chronic disease states was given. Management strategies discussed on successive visits include dietary and  exercise recommendations, goals of achieving and maintaining IBW, and lifestyle modifications aiming for adequate sleep and minimizing stressors.  ? ?Follow up: oct for cpe ? ?Orders Placed This Encounter  ?Procedures  ? DG Lumbar Spine Complete  ? Comprehensive metabolic panel  ? CBC with Differential/Platelet  ? Sedimentation rate  ? HLA-B27 Antigen  ? ?Meds ordered this encounter  ?Medications  ? metroNIDAZOLE (FLAGYL) 500 MG tablet  ?  Sig: Take 1 tablet (500 mg total) by mouth 2 (two) times daily for 7 days.  ?  Dispense:  14 tablet  ?  Refill:  0  ? diclofenac (VOLTAREN) 75 MG EC tablet  ?  Sig: Take 1 tablet (75 mg total) by mouth 2 (two) times daily as needed.  ?  Dispense:  60 tablet  ?  Refill:  1  ? ?  ? ?BP Readings from Last 3 Encounters:  ?02/15/22 138/77  ?08/30/21 130/80  ?03/29/21 138/88  ? ?Wt Readings from Last 3 Encounters:  ?02/15/22 239 lb 12.8 oz (108.8 kg)  ?08/30/21 234 lb 9.6 oz (106.4 kg)  ?03/29/21 241 lb (109.3 kg)  ? ? ?Lab Results  ?Component Value Date  ? CHOL 156 08/30/2021  ? CHOL 164 09/04/2020  ? CHOL 134 05/25/2019  ? ?Lab Results  ?Component Value Date  ? HDL 43.60 08/30/2021  ? HDL 46 09/04/2020  ? HDL 39.20 05/25/2019  ? ?Lab Results  ?Component Value Date  ? [place] 75 08/30/2021  ? [place] 94 09/04/2020  ?  [place] 62 05/25/2019  ? ?Lab Results  ?Component Value Date  ? TRIG 185.0 (H) 08/30/2021  ? TRIG 140 09/04/2020  ? TRIG 163.0 (H) 05/25/2019  ? ?Lab Results  ?Component Value Date  ? CHOLHDL 4 08/30/2021  ? CHOLHDL 3.6 09/04/2020  ? CHOLHDL 3 05/25/2019  ? ?Lab Results  ?Component Value Date  ? LDLDIRECT 84.0 03/16/2018  ? ?Lab Results  ?Component Value Date  ? CREATININE 1.04 08/30/2021  ? BUN 20 08/30/2021  ? NA 139 08/30/2021  ? K 4.6 08/30/2021  ? CL 104 08/30/2021  ? CO2 30 08/30/2021  ? ? ?The 10-year ASCVD risk score (Arnett DK, et al., 2019) is: 25.4% ?  Values used to calculate the score: ?    Age: 46 years ?    Sex: Male ?    Is Non-Hispanic African American:  No ?    Diabetic: No ?    Tobacco smoker: Yes ?    Systolic Blood Pressure: 818 mmHg ?    Is BP treated: Yes ?    HDL Cholesterol: 43.6 mg/dL ?    Total Cholesterol: 156 mg/dL ? ?I reviewed the patients updated PMH, FH, and SocHx.  ?  ?Patient Active Problem List  ? Diagnosis Date Noted  ? Dependent edema 02/28/2021  ? Basal cell carcinoma (BCC) of skin of left upper extremity including shoulder 03/12/2017  ? Colon polyps 05/01/2015  ? Family history of colon cancer 01/31/2014  ? IFG (impaired fasting glucose) 05/07/2011  ? Acquired hypothyroidism 01/28/2011  ? White coat syndrome with diagnosis of hypertension 01/28/2011  ? Mixed hyperlipidemia 01/28/2011  ? 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 Allergic rhinitis 07/11/2010  ? BMI 30.0-30.9,adult 04/17/2010  ? ? ?Allergies: Hydrochlorothiazide and Niacin and related ? ?Social History: ?Patient  reports that he has never smoked. He has never used smokeless tobacco. He reports that he does not drink alcohol and does not use drugs. ? ?Current Meds  ?Medication Sig  ? amLODipine (NORVASC) 5 MG tablet Take 1 tablet (5 mg total) by mouth daily.  ? B Complex Vitamins (B COMPLEX-B12) TABS   ? Cholecalciferol (D3 2000) 50 MCG (2000 UT) CAPS   ? Coenzyme Q10 300 MG CAPS   ? diclofenac (VOLTAREN) 75 MG EC tablet Take 1 tablet (75 mg total) by mouth 2 (two) times daily as needed.  ? diclofenac sodium (VOLTAREN) 1 % GEL Apply 2 g topically 4 (four) times daily as needed.  ? fish oil-omega-3 fatty acids 1000 MG capsule Take 1 g by mouth 2 (two) times daily.  ? Flaxseed, Linseed, (FLAX SEEDS PO) Take by mouth. With omega 3  ? furosemide (LASIX) 20 MG tablet Take 1 tablet (20 mg total) by mouth daily as needed.  ? GARLIC PO Take 1 tablet by mouth daily.  ? levothyroxine (SYNTHROID) 137 MCG tablet TAKE 1 TABLET BY MOUTH EVERY DAY  ? lisinopril (ZESTRIL) 40 MG tablet Take 1 tablet (40 mg total) by mouth daily.  ? Magnesium 250 MG TABS   ? metoprolol succinate (TOPROL-XL) 100 MG 24 hr  tablet Take 1 tablet (100 mg total) by mouth daily. Take with or immediately following a meal.  ? metroNIDAZOLE (FLAGYL) 500 MG tablet Take 1 tablet (500 mg total) by mouth 2 (two) times daily for 7 days.  ? Red Yeast Rice Extract (RED YEAST RICE PO) Take by mouth.  ? rosuvastatin (CRESTOR) 20 MG tablet Take 1 tablet (20 mg total) by mouth at bedtime.  ? Zinc 50 MG  TABS   ? ? ?Review of Systems: ?Cardiovascular: negative for chest pain, palpitations, leg swelling, orthopnea ?Respiratory: negative for SOB, wheezing or persistent cough ?Gastrointestinal: negative for abdominal pain ?Genitourinary: negative for dysuria or gross hematuria ? ?Objective  ?Vitals: BP 138/77   Pulse 65   Temp 98.5 ?F (36.9 ?C)   Ht 5' 11"  (1.803 m)   Wt 239 lb 12.8 oz (108.8 kg)   SpO2 97%   BMI 33.45 kg/m?  ?General: no acute distress  ?Psych:  Alert and oriented, normal mood and affect ?HEENT:  Normocephalic, atraumatic, supple neck  ?Cardiovascular:  RRR without murmur. no edema ?Respiratory:  Good breath sounds bilaterally, CTAB with normal respiratory effort ?Abd: ttp lower abdomen /wo rebound or guarding. Soft, nl bs. No masses ?Skin:  Warm, no rashes ?Neurologic:   Mental status is normal ?Back: decreased flexion, pain with extension. Neg slr bilaterally. Nl gait. ?Commons side effects, risks, benefits, and alternatives for medications and treatment plan prescribed today were discussed, and the patient expressed understanding of the given instructions. Patient is instructed to call or message via MyChart if he/she has any questions or concerns regarding our treatment plan. No barriers to understanding were identified. We discussed Red Flag symptoms and signs in detail. Patient expressed understanding regarding what to do in case of urgent or emergency type symptoms.  ?Medication list was reconciled, printed and provided to the patient in AVS. Patient instructions and summary information was reviewed with the patient as  documented in the AVS. ?This note was prepared with assistance of Systems analyst. Occasional wrong-word or sound-a-like substitutions may have occurred due to the inherent limitations of voice recognition

## 2022-02-17 LAB — HLA-B27 ANTIGEN: HLA-B27 Antigen: NEGATIVE

## 2022-03-04 DIAGNOSIS — M9905 Segmental and somatic dysfunction of pelvic region: Secondary | ICD-10-CM | POA: Diagnosis not present

## 2022-03-04 DIAGNOSIS — M5442 Lumbago with sciatica, left side: Secondary | ICD-10-CM | POA: Diagnosis not present

## 2022-03-04 DIAGNOSIS — M9903 Segmental and somatic dysfunction of lumbar region: Secondary | ICD-10-CM | POA: Diagnosis not present

## 2022-03-04 DIAGNOSIS — M9902 Segmental and somatic dysfunction of thoracic region: Secondary | ICD-10-CM | POA: Diagnosis not present

## 2022-03-07 DIAGNOSIS — M9905 Segmental and somatic dysfunction of pelvic region: Secondary | ICD-10-CM | POA: Diagnosis not present

## 2022-03-07 DIAGNOSIS — M5442 Lumbago with sciatica, left side: Secondary | ICD-10-CM | POA: Diagnosis not present

## 2022-03-07 DIAGNOSIS — M9903 Segmental and somatic dysfunction of lumbar region: Secondary | ICD-10-CM | POA: Diagnosis not present

## 2022-03-07 DIAGNOSIS — M9902 Segmental and somatic dysfunction of thoracic region: Secondary | ICD-10-CM | POA: Diagnosis not present

## 2022-03-18 DIAGNOSIS — D0462 Carcinoma in situ of skin of left upper limb, including shoulder: Secondary | ICD-10-CM | POA: Diagnosis not present

## 2022-03-18 DIAGNOSIS — L821 Other seborrheic keratosis: Secondary | ICD-10-CM | POA: Diagnosis not present

## 2022-05-15 DIAGNOSIS — M9905 Segmental and somatic dysfunction of pelvic region: Secondary | ICD-10-CM | POA: Diagnosis not present

## 2022-05-15 DIAGNOSIS — M9902 Segmental and somatic dysfunction of thoracic region: Secondary | ICD-10-CM | POA: Diagnosis not present

## 2022-05-15 DIAGNOSIS — M9903 Segmental and somatic dysfunction of lumbar region: Secondary | ICD-10-CM | POA: Diagnosis not present

## 2022-05-15 DIAGNOSIS — M5442 Lumbago with sciatica, left side: Secondary | ICD-10-CM | POA: Diagnosis not present

## 2022-08-02 ENCOUNTER — Ambulatory Visit (INDEPENDENT_AMBULATORY_CARE_PROVIDER_SITE_OTHER): Payer: Medicare HMO

## 2022-08-02 ENCOUNTER — Ambulatory Visit
Admission: EM | Admit: 2022-08-02 | Discharge: 2022-08-02 | Disposition: A | Discharge status: Home / Self Care | Payer: Medicare HMO | Attending: Urgent Care | Admitting: Urgent Care

## 2022-08-02 ENCOUNTER — Ambulatory Visit: Payer: Medicare HMO

## 2022-08-02 DIAGNOSIS — M25432 Effusion, left wrist: Secondary | ICD-10-CM

## 2022-08-02 DIAGNOSIS — S6992XA Unspecified injury of left wrist, hand and finger(s), initial encounter: Secondary | ICD-10-CM

## 2022-08-02 DIAGNOSIS — M25532 Pain in left wrist: Secondary | ICD-10-CM

## 2022-08-02 DIAGNOSIS — M19032 Primary osteoarthritis, left wrist: Secondary | ICD-10-CM | POA: Diagnosis not present

## 2022-08-02 DIAGNOSIS — S6990XA Unspecified injury of unspecified wrist, hand and finger(s), initial encounter: Secondary | ICD-10-CM | POA: Insufficient documentation

## 2022-08-02 NOTE — Discharge Instructions (Addendum)
The xray does not show any fracture (broken bone).    Take ibuprofen or Tylenol as needed.  Rest and elevate your wrist.  Apply ice packs 2-3 times a day for up to 20 minutes each.      Follow up with an orthopedist if your symptoms are not improving.

## 2022-08-02 NOTE — ED Triage Notes (Signed)
Patient to Urgent Care with complaints of a left hand/ wrist injury. Reports that he was working on his camper and a spring loaded slide topper hit his wrist.   Patient with pain and swelling/ bruising to left wrist.

## 2022-08-02 NOTE — ED Provider Notes (Signed)
[facility]    CSN: [id number] Arrival date & time: 08/02/22  1810      History   Chief Complaint Chief Complaint  Patient presents with   Hand Injury    HPI Rodney Pineda is a 71 y.o. male.  Patient presents with left wrist pain, swelling, bruising x5 days.  He was working with a piece of equipment and the metal topper hit his wrist several times.  He treated it with ice packs and ibuprofen.  He does have a small abrasion in the area.  No wound drainage.  He denies numbness, weakness, paresthesias, fever, chills, or other symptoms.  His medical history includes hypertension.  The history is provided by the patient, the spouse and medical records.    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    Patient Active Problem List   Diagnosis Date Noted   Wrist injury 08/02/2022   Dependent edema 02/28/2021   Basal cell carcinoma (BCC) of skin of left upper extremity including shoulder 03/12/2017   Colon polyps 05/01/2015   Family history of colon cancer 01/31/2014   IFG (impaired fasting glucose) 05/07/2011   Acquired hypothyroidism 01/28/2011   White coat syndrome with diagnosis of hypertension 01/28/2011   Mixed hyperlipidemia 01/28/2011   Osteoarthritis, 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 Authorizing Provider  amLODipine (NORVASC) 5 MG tablet Take 1 tablet (5 mg total) by mouth daily. 11/23/21   Leamon Arnt, MD  B Complex Vitamins (B COMPLEX-B12) TABS  07/01/20   [provider]  Cholecalciferol (D3 2000)  50 MCG (2000 UT) CAPS  04/20/21   [provider]  Coenzyme Q10 300 MG CAPS  04/20/21   [provider]  diclofenac (VOLTAREN) 75 MG EC tablet Take 1 tablet (75 mg total) by mouth 2 (two) times daily as needed. 02/15/22   Leamon Arnt, MD  diclofenac sodium (VOLTAREN) 1 % GEL Apply 2 g topically 4 (four) times daily as needed. 03/16/18   Leamon Arnt, MD  fish oil-omega-3 fatty acids 1000 MG capsule Take 1 g by mouth 2 (two) times daily.    [provider]  Flaxseed, Linseed, (FLAX SEEDS PO) Take by mouth. With omega 3    [provider]  furosemide (LASIX) 20 MG tablet Take 1 tablet (20 mg total) by mouth daily as needed. 11/23/21   Leamon Arnt, MD  GARLIC PO Take 1 tablet by mouth daily.    [provider]  levothyroxine (SYNTHROID) 137 MCG tablet TAKE 1 TABLET BY MOUTH EVERY DAY 11/22/21   Leamon Arnt, MD  lisinopril (ZESTRIL) 40 MG tablet Take 1 tablet (40 mg total) by mouth daily. 11/23/21   Leamon Arnt, MD  Magnesium 250 MG TABS     [provider]  metoprolol succinate (TOPROL-XL) 100 MG 24 hr tablet Take 1 tablet (100 mg total) by mouth daily. Take with or immediately following a meal. 11/23/21  Leamon Arnt, MD  Misc Natural Products (BLACK CHERRY CONCENTRATE PO)  07/01/20   [provider]  Red Yeast Rice Extract (RED YEAST RICE PO) Take by mouth.    [provider]  rosuvastatin (CRESTOR) 20 MG tablet Take 1 tablet (20 mg total) by mouth at bedtime. 11/23/21   Leamon Arnt, MD  Zinc 50 MG TABS     [provider]    Family History Family History  Problem Relation Age of Onset   Colon cancer Mother    Hypertension Father    Hyperlipidemia Father    Hypertension Sister    Hypertension Brother    Cancer Maternal Grandmother        unknown type   Colon cancer Maternal Aunt    Lung cancer Maternal Aunt    Hypertension Son     Social History Social History   Tobacco Use    Smoking status: Never   Smokeless tobacco: Never  Vaping Use   Vaping Use: Never used  Substance Use Topics   Alcohol use: No   Drug use: No     Allergies   Hydrochlorothiazide and Niacin and related   Review of Systems Review of Systems  Constitutional:  Negative for chills and fever.  Musculoskeletal:  Positive for arthralgias and joint swelling.  Skin:  Positive for color change and wound.  Neurological:  Negative for weakness and numbness.  All other systems reviewed and are negative.    Physical Exam Triage Vital Signs ED Triage Vitals  Enc Vitals Group     BP      Pulse      Resp      Temp      Temp src      SpO2      Weight      Height      Head Circumference      Peak Flow      Pain Score      Pain Loc      Pain Edu?      Excl. in [place]?    No data found.  Updated Vital Signs BP (!) 145/79   Pulse 63   Temp 97.9 F (36.6 C)   Resp 18   Ht '5\' 11"'$  (1.803 m)   Wt 234 lb (106.1 kg)   SpO2 97%   BMI 32.64 kg/m   Visual Acuity Right Eye Distance:   Left Eye Distance:   Bilateral Distance:    Right Eye Near:   Left Eye Near:    Bilateral Near:     Physical Exam Vitals and nursing note reviewed.  Constitutional:      General: He is not in acute distress.    Appearance: Normal appearance. He is well-developed. He is not ill-appearing.  HENT:     Mouth/Throat:     Mouth: Mucous membranes are moist.  Cardiovascular:     Rate and Rhythm: Normal rate and regular rhythm.     Heart sounds: Normal heart sounds.  Pulmonary:     Effort: Pulmonary effort is normal. No respiratory distress.     Breath sounds: Normal breath sounds.  Musculoskeletal:        General: Swelling and tenderness present. No deformity. Normal range of motion.       Hands:     Cervical back: Neck supple.     Comments: Ecchymosis and edema of left wrist. FROM, 2+ pulse, sensation intact, strength 5/5.   Skin:    General:  Skin is warm and dry.     Capillary Refill:  Capillary refill takes less than 2 seconds.     Findings: Bruising present.  Neurological:     General: No focal deficit present.     Mental Status: He is alert and oriented to person, place, and time.     Sensory: No sensory deficit.     Motor: No weakness.  Psychiatric:        Mood and Affect: Mood normal.        Behavior: Behavior normal.      UC Treatments / Results  Labs (all labs ordered are listed, but only abnormal results are displayed) Labs Reviewed - No data to display  EKG   Radiology DG Wrist Complete Left  Result Date: 08/02/2022 CLINICAL DATA:  Trauma, pain x4 days EXAM: LEFT WRIST - COMPLETE 3+ VIEW COMPARISON:  None Available. FINDINGS: No recent fracture or dislocation is seen. Marked degenerative changes are noted in first carpometacarpal joint. Bony spurs are also noted in the intercarpal joint along the lateral aspect of the wrist and in the first metacarpophalangeal joint. The soft tissue swelling around the wrist. IMPRESSION: No recent fracture or dislocation is seen. Degenerative changes in multiple joints as described in the body of the report. Electronically Signed   By: Elmer Picker M.D.   On: 08/02/2022 18:40    Procedures Procedures (including critical care time)  Medications Ordered in UC Medications - No data to display  Initial Impression / Assessment and Plan / UC Course  I have reviewed the triage vital signs and the nursing notes.  Pertinent labs & imaging results that were available during my care of the patient were reviewed by me and considered in my medical decision making (see chart for details).   Pain and swelling of left wrist due to injury.  The injury occurred 5 days ago.  X-ray negative for acute bony abnormality.  Discussed symptomatic treatment including Tylenol or ibuprofen, rest, ice packs, elevation.  Instructed patient to follow-up with orthopedics if his symptoms are not improving.  Contact information for on-call Ortho  provided.  Education provided on wrist pain.  Patient agrees to plan of care.   Final Clinical Impressions(s) / UC Diagnoses   Final diagnoses:  Injury of left wrist, initial encounter  Pain and swelling of left wrist     Discharge Instructions      The xray does not show any fracture (broken bone).    Take ibuprofen or Tylenol as needed.  Rest and elevate your wrist.  Apply ice packs 2-3 times a day for up to 20 minutes each.      Follow up with an orthopedist if your symptoms are not improving.        ED Prescriptions   None    PDMP not reviewed this encounter.   Sharion Balloon, NP 08/02/22 [phone]

## 2022-08-19 ENCOUNTER — Encounter: Payer: Self-pay | Admitting: *Deleted

## 2022-09-02 ENCOUNTER — Encounter: Payer: Self-pay | Admitting: Family Medicine

## 2022-09-02 ENCOUNTER — Ambulatory Visit (INDEPENDENT_AMBULATORY_CARE_PROVIDER_SITE_OTHER): Payer: Medicare HMO | Admitting: Family Medicine

## 2022-09-02 VITALS — BP 124/72 | HR 63 | Temp 98.8°F | Ht 71.0 in | Wt 236.8 lb

## 2022-09-02 DIAGNOSIS — M19042 Primary osteoarthritis, left hand: Secondary | ICD-10-CM

## 2022-09-02 DIAGNOSIS — Z6830 Body mass index (BMI) 30.0-30.9, adult: Secondary | ICD-10-CM | POA: Diagnosis not present

## 2022-09-02 DIAGNOSIS — Z0000 Encounter for general adult medical examination without abnormal findings: Secondary | ICD-10-CM | POA: Diagnosis not present

## 2022-09-02 DIAGNOSIS — E039 Hypothyroidism, unspecified: Secondary | ICD-10-CM

## 2022-09-02 DIAGNOSIS — I10 Essential (primary) hypertension: Secondary | ICD-10-CM

## 2022-09-02 DIAGNOSIS — M19041 Primary osteoarthritis, right hand: Secondary | ICD-10-CM | POA: Diagnosis not present

## 2022-09-02 DIAGNOSIS — R7301 Impaired fasting glucose: Secondary | ICD-10-CM | POA: Diagnosis not present

## 2022-09-02 DIAGNOSIS — E782 Mixed hyperlipidemia: Secondary | ICD-10-CM | POA: Diagnosis not present

## 2022-09-02 LAB — CBC WITH DIFFERENTIAL/PLATELET
Basophils Absolute: 0 10*3/uL (ref 0.0–0.1)
Basophils Relative: 0.8 % (ref 0.0–3.0)
Eosinophils Absolute: 0.3 10*3/uL (ref 0.0–0.7)
Eosinophils Relative: 4.4 % (ref 0.0–5.0)
HCT: 42.5 % (ref 39.0–52.0)
Hemoglobin: 14.6 g/dL (ref 13.0–17.0)
Lymphocytes Relative: 31 % (ref 12.0–46.0)
Lymphs Abs: 1.9 10*3/uL (ref 0.7–4.0)
MCHC: 34.4 g/dL (ref 30.0–36.0)
MCV: 86.6 fl (ref 78.0–100.0)
Monocytes Absolute: 0.4 10*3/uL (ref 0.1–1.0)
Monocytes Relative: 6.5 % (ref 3.0–12.0)
Neutro Abs: 3.5 10*3/uL (ref 1.4–7.7)
Neutrophils Relative %: 57.3 % (ref 43.0–77.0)
Platelets: 174 10*3/uL (ref 150.0–400.0)
RBC: 4.91 Mil/uL (ref 4.22–5.81)
RDW: 12.7 % (ref 11.5–15.5)
WBC: 6 10*3/uL (ref 4.0–10.5)

## 2022-09-02 LAB — LIPID PANEL
Cholesterol: 145 mg/dL (ref 0–200)
HDL: 48.4 mg/dL (ref >39.00)
LDL Cholesterol: 72 mg/dL (ref 0–99)
NonHDL: 96.96
Total CHOL/HDL Ratio: 3
Triglycerides: 123 mg/dL (ref 0.0–149.0)
VLDL: 24.6 mg/dL (ref 0.0–40.0)

## 2022-09-02 LAB — COMPREHENSIVE METABOLIC PANEL
ALT: 13 U/L (ref 0–53)
AST: 15 U/L (ref 0–37)
Albumin: 4.2 g/dL (ref 3.5–5.2)
Alkaline Phosphatase: 57 U/L (ref 39–117)
BUN: 16 mg/dL (ref 6–23)
CO2: 26 mEq/L (ref 19–32)
Calcium: 9.6 mg/dL (ref 8.4–10.5)
Chloride: 104 mEq/L (ref 96–112)
Creatinine, Ser: 0.94 mg/dL (ref 0.40–1.50)
GFR: 82.64 mL/min (ref >60.00)
Glucose, Bld: 111 mg/dL — ABNORMAL HIGH (ref 70–99)
Potassium: 4.2 mEq/L (ref 3.5–5.1)
Sodium: 139 mEq/L (ref 135–145)
Total Bilirubin: 0.8 mg/dL (ref 0.2–1.2)
Total Protein: 7.1 g/dL (ref 6.0–8.3)

## 2022-09-02 LAB — TSH: TSH: 1.35 u[IU]/mL (ref 0.35–5.50)

## 2022-09-02 MED ORDER — DICLOFENAC SODIUM 75 MG PO TBEC: 75.0000 mg | DELAYED_RELEASE_TABLET | Freq: Two times a day (BID) | ORAL | 1 refills | Status: DC | PRN

## 2022-09-02 NOTE — Patient Instructions (Signed)
Please return in 12 months for your annual complete physical; please come fasting.   Keep monitoring your blood pressures at home.   I will release your lab results to you on your MyChart account with further instructions. You may see the results before I do, but when I review them I will send you a message with my report or have my assistant call you if things need to be discussed. Please reply to my message with any questions. Thank you!   If you have any questions or concerns, please don't hesitate to send me a message via MyChart or call the office at [phone]. Thank you for visiting with [place] today! It's our pleasure caring for you.

## 2022-09-02 NOTE — Progress Notes (Signed)
Subjective  Chief Complaint  Patient presents with   Annual Exam    Pt here for Annual exam and is currently fasting     HPI: Rodney Pineda is a 71 y.o. male who presents to [facility] [place] today for a Male Wellness Visit. He also has the concerns and/or needs as listed above in the chief complaint. These will be addressed in addition to the Health Maintenance Visit.   Wellness Visit: annual visit with health maintenance review and exam   Health maintenance: Screens are current.  Eye exam and annual dermatology visits up-to-date.  Declines immunizations.  Colorectal cancer screening due next year.  History of polyps.  Feeling well.  Working 3 days/week.  No new concerns  Body mass index is 33.03 kg/m. Wt Readings from Last 3 Encounters:  09/02/22 236 lb 12.8 oz (107.4 kg)  08/02/22 234 lb (106.1 kg)  02/15/22 239 lb 12.8 oz (108.8 kg)     Chronic disease management visit and/or acute problem visit: Hypertension with whitecoat response: Elevated blood pressure in office today however, home readings continue to be normal.  He reports they average 120s over 70s.  At times diastolics will be in the 52D.  He denies chest pain, shortness of breath.  He has intermittent lower extremity edema for which he uses Lasix. Hypothyroidism on levothyroxine 137 mcg daily.  Energy levels are good.  Sleep is normal.  No symptoms of high or low thyroid.  Compliant with medications. Impaired fasting glucose: Continues to eat well.  Weight is stable.  No symptoms of hyperglycemia Hyperlipidemia on Crestor 20 mg nightly.  Tolerating well.  Fasting for recheck. Osteoarthritis: Low back pain, occasional hand pain.  Manages symptomatically.  Request refill for diclofenac.  Uses intermittent.  Patient Active Problem List   Diagnosis Date Noted   Wrist injury 08/02/2022   Dependent edema 02/28/2021   Basal cell carcinoma (BCC) of skin of left upper extremity including shoulder  03/12/2017   Colon polyps 05/01/2015   Family history of colon cancer 01/31/2014   IFG (impaired fasting glucose) 05/07/2011   Acquired hypothyroidism 01/28/2011   White coat syndrome with diagnosis of hypertension 01/28/2011   Mixed hyperlipidemia 01/28/2011   Osteoarthritis, 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   Allergic rhinitis 07/11/2010   BMI 30.0-30.9,adult 04/17/2010   Health Maintenance  Topic Date Due   COVID-19 Vaccine (1) 09/18/2022 (Originally 01/14/1956)   INFLUENZA VACCINE  02/23/2023 (Originally 06/25/2022)   COLONOSCOPY (Pts 45-67yr Insurance coverage will need to be confirmed)  05/26/2023   TETANUS/TDAP  05/24/2029   Pneumonia Vaccine 71 Years old  Completed   Hepatitis C Screening  Completed   Zoster Vaccines- Shingrix  Completed   HPV VACCINES  Aged Out   Immunization History  Administered Date(s) Administered   Fluad Quad(high Dose 65+) 08/29/2021   Influenza Whole 09/09/2019   Influenza, High Dose Seasonal PF 09/14/2018   Pneumococcal Conjugate-13 03/16/2018   Pneumococcal Polysaccharide-23 05/25/2019   Tdap 08/14/2009, 05/25/2019   Zoster Recombinat (Shingrix) 08/21/2021, 02/07/2022   Zoster, Live 01/31/2014   We updated and reviewed the patient's past history in detail and it is documented below. Allergies: Patient is allergic to hydrochlorothiazide and niacin and related. Past Medical History  has a past medical history of Disease of thyroid gland, Diverticulitis, Family history of colon cancer, Hyperlipidemia, Hypertension, and Snake envenomation. Past Surgical History Patient  has a past surgical history that includes Appendectomy (1995) and Cholecystectomy (2010). Social History Patient  reports that he has  never smoked. He has never used smokeless tobacco. He reports that he does not drink alcohol and does not use drugs. Family History family history includes Cancer in his maternal grandmother; Colon cancer in his maternal aunt and mother; Hyperlipidemia in his  father; Hypertension in his brother, father, sister, and son; Lung cancer in his maternal aunt. Review of Systems: Constitutional: negative for fever or malaise Ophthalmic: negative for photophobia, double vision or loss of vision Cardiovascular: negative for chest pain, dyspnea on exertion, or new LE swelling Respiratory: negative for SOB or persistent cough Gastrointestinal: negative for abdominal pain, change in bowel habits or melena Genitourinary: negative for dysuria or gross hematuria Musculoskeletal: negative for new gait disturbance or muscular weakness Integumentary: negative for new or persistent rashes Neurological: negative for TIA or stroke symptoms Psychiatric: negative for SI or delusions Allergic/Immunologic: negative for hives  Patient Care Team    Relationship Specialty Notifications Start End  Leamon Arnt, MD PCP - General Family Medicine  03/01/13   Juanita Craver, MD Consulting Physician Gastroenterology  03/16/18    Objective  Vitals: BP 124/72 Comment: by consistent home readings  Pulse 63   Temp 98.8 F (37.1 C)   Ht '5\' 11"'$  (1.803 m)   Wt 236 lb 12.8 oz (107.4 kg)   SpO2 94%   BMI 33.03 kg/m  General:  Well developed, well nourished, no acute distress  Psych:  Alert and orientedx3,normal mood and affect HEENT:  Normocephalic, atraumatic, non-icteric sclera, PERRL, oropharynx is clear without mass or exudate, supple neck without adenopathy, mass or thyromegaly Cardiovascular:  Normal S1, S2, RRR without gallop, rub , soft systolic murmur today at LUSB Respiratory:  Good breath sounds bilaterally, CTAB with normal respiratory effort Gastrointestinal: normal bowel sounds, soft, non-tender, no noted masses. No HSM MSK: no deformities, contusions. Joints are without erythema or swelling. Spine and CVA region are nontender Skin:  Warm, no rashes or suspicious lesions noted Neurologic:    Mental status is normal. CN 2-11 are normal. Gross motor and sensory exams  are normal. Stable gait. Mild fine bilateral tremor GU: No inguinal hernias or adenopathy are appreciated bilaterally   Assessment  1. Annual physical exam   2. Acquired hypothyroidism   3. White coat syndrome with diagnosis of hypertension   4. BMI 30.0-30.9,adult   5. IFG (impaired fasting glucose)   6. Mixed hyperlipidemia   7. Primary osteoarthritis of both hands      Plan  Male Wellness Visit: Age appropriate Health Maintenance and Prevention measures were discussed with patient. Included topics are cancer screening recommendations, ways to keep healthy (see AVS) including dietary and exercise recommendations, regular eye and dental care, use of seat belts, and avoidance of moderate alcohol use and tobacco use.  BMI: discussed patient's BMI and encouraged positive lifestyle modifications to help get to or maintain a target BMI. HM needs and immunizations were addressed and ordered. See below for orders. See HM and immunization section for updates. Routine labs and screening tests ordered including cmp, cbc and lipids where appropriate. Discussed recommendations regarding Vit D and calcium supplementation (see AVS)  Chronic disease f/u and/or acute problem visit: (deemed necessary to be done in addition to the wellness visit): Hypertension with whitecoat response: Continue home monitoring.  Continue metoprolol XL 100 daily, lisinopril 40 mg daily and amlodipine 10 mg daily.  Recheck renal function electrolytes. Recheck TSH and levothyroxine 137 mcg daily.  Clinically euthyroid Recheck A1c.  Continue low-carb diet and weight maintenance Hyperlipidemia on Crestor  20 nightly.  Recheck lipid panel with LFTs. Osteoarthritis: Voltaren refilled  Follow up: 12 months for complete physical and recheck [facility] side effects, risks, benefits, and alternatives for medications and treatment plan prescribed today were discussed, and the patient expressed understanding of the given instructions.  Patient is instructed to call or message via MyChart if he/she has any questions or concerns regarding our treatment plan. No barriers to understanding were identified. We discussed Red Flag symptoms and signs in detail. Patient expressed understanding regarding what to do in case of urgent or emergency type symptoms.  Medication list was reconciled, printed and provided to the patient in AVS. Patient instructions and summary information was reviewed with the patient as documented in the AVS. This note was prepared with assistance of Dragon voice recognition software. Occasional wrong-word or sound-a-like substitutions may have occurred due to the inherent limitations of voice recognition software    Orders Placed This Encounter  Procedures   CBC with Differential/Platelet   Comprehensive metabolic panel   Lipid panel   TSH   Meds ordered this encounter  Medications   diclofenac (VOLTAREN) 75 MG EC tablet    Sig: Take 1 tablet (75 mg total) by mouth 2 (two) times daily as needed.    Dispense:  60 tablet    Refill:  1

## 2022-09-05 NOTE — Progress Notes (Signed)
Subjective:   Rodney Pineda is a 71 y.o. male who presents for Medicare Annual/Subsequent preventive examination. I connected with  Jymir Dunaj on 09/06/22 by a audio enabled telemedicine application and verified that I am speaking with the correct person using two identifiers.  Patient Location: Home  Provider Location: Home Office  I discussed the limitations of evaluation and management by telemedicine. The patient expressed understanding and agreed to proceed.   Review of Systems    Deferred to PCP Cardiac Risk Factors include: advanced age (>77mn, >>75women);dyslipidemia;male gender;hypertension;obesity (BMI >30kg/m2)     Objective:    Today's Vitals   09/06/22 1049  PainSc: 2    There is no height or weight on file to calculate BMI.     09/06/2022   10:57 AM 08/02/2022    6:35 PM 08/24/2021   11:32 AM  Advanced Directives  Does Patient Have a Medical Advance Directive? No No Yes  Does patient want to make changes to medical advance directive?   Yes (MAU/Ambulatory/Procedural Areas - Information given)  Would patient like information on creating a medical advance directive? No - Patient declined      Current Medications (verified) Outpatient Encounter Medications as of 09/06/2022  Medication Sig   amLODipine (NORVASC) 5 MG tablet Take 1 tablet (5 mg total) by mouth daily.   B Complex Vitamins (B COMPLEX-B12) TABS    Cholecalciferol (D3 2000) 50 MCG (2000 UT) CAPS    Coenzyme Q10 300 MG CAPS    diclofenac (VOLTAREN) 75 MG EC tablet Take 1 tablet (75 mg total) by mouth 2 (two) times daily as needed.   diclofenac sodium (VOLTAREN) 1 % GEL Apply 2 g topically 4 (four) times daily as needed.   fish oil-omega-3 fatty acids 1000 MG capsule Take 1 g by mouth 2 (two) times daily.   Flaxseed, Linseed, (FLAX SEEDS PO) Take by mouth. With omega 3   furosemide (LASIX) 20 MG tablet Take 1 tablet (20 mg total) by mouth daily as needed.   GARLIC PO Take 1 tablet by mouth  daily.   levothyroxine (SYNTHROID) 137 MCG tablet TAKE 1 TABLET BY MOUTH EVERY DAY   lisinopril (ZESTRIL) 40 MG tablet Take 1 tablet (40 mg total) by mouth daily.   Magnesium 250 MG TABS    metoprolol succinate (TOPROL-XL) 100 MG 24 hr tablet Take 1 tablet (100 mg total) by mouth daily. Take with or immediately following a meal.   Red Yeast Rice Extract (RED YEAST RICE PO) Take by mouth.   rosuvastatin (CRESTOR) 20 MG tablet Take 1 tablet (20 mg total) by mouth at bedtime.   Zinc 50 MG TABS    No facility-administered encounter medications on file as of 09/06/2022.    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   Past Surgical History:  Procedure Laterality Date   APPENDECTOMY  1995   CHOLECYSTECTOMY  2010   Family History  Problem Relation Age of Onset   Colon cancer Mother    Hypertension Father    Hyperlipidemia Father    Hypertension Sister    Hypertension Brother    Cancer Maternal Grandmother        unknown  type   Colon cancer Maternal Aunt    Lung cancer Maternal Aunt    Hypertension Son    Social History   Socioeconomic History   Marital status: Married    Spouse name: Margaretha Sheffield   Number of children: 4   Years of education: 12   Highest education level: Not on file  Occupational History   Not on file  Tobacco Use   Smoking status: Never   Smokeless tobacco: Never  Vaping Use   Vaping Use: Never used  Substance and Sexual Activity   Alcohol use: No   Drug use: No   Sexual activity: Yes  Other Topics Concern   Not on file  Social History Narrative   Not on file   Social Determinants of Health   Financial Resource Strain: Low Risk  (09/06/2022)   Overall Financial Resource Strain  (CARDIA)    Difficulty of Paying Living Expenses: Not hard at all  Food Insecurity: No Food Insecurity (09/06/2022)   Hunger Vital Sign    Worried About Running Out of Food in the Last Year: Never true    Ran Out of Food in the Last Year: Never true  Transportation Needs: No Transportation Needs (09/06/2022)   PRAPARE - Hydrologist (Medical): No    Lack of Transportation (Non-Medical): No  Physical Activity: Sufficiently Active (09/06/2022)   Exercise Vital Sign    Days of Exercise per Week: 5 days    Minutes of Exercise per Session: 50 min  Stress: No Stress Concern Present (09/06/2022)   [place]    Feeling of Stress : Not at all  Social Connections: [place] (09/06/2022)   Social Connection and Isolation Panel [NHANES]    Frequency of Communication with Friends and Family: More than three times a week    Frequency of Social Gatherings with Friends and Family: More than three times a week    Attends Religious Services: 1 to 4 times per year    Active Member of [employer] or Organizations: Yes    Attends Archivist Meetings: Never    Marital Status: Married    Tobacco Counseling Counseling given: Not Answered   Clinical Intake:  Pre-visit preparation completed: Yes  Pain : 0-10 Pain Score: 2  Pain Type: Chronic pain Pain Location: Back     Nutritional Status: BMI 25 -29 Overweight Nutritional Risks: None Diabetes: No  How often do you need to have someone help you when you read instructions, pamphlets, or other written materials from your doctor or pharmacy?: 1 - Never What is the last grade level you completed in school?: 12th  Diabetic?No     Information entered by :: Emelia Loron   Activities of Daily Living    09/06/2022   10:55 AM  In your present state of health, do you have any difficulty performing the following activities:  Hearing? 0   Vision? 0  Difficulty concentrating or making decisions? 0  Walking or climbing stairs? 0  Dressing or bathing? 0  Doing errands, shopping? 0  Preparing Food and eating ? N  Using the Toilet? N  In the past six months, have you accidently leaked urine? N  Do you have problems with loss of bowel control? N  Managing your Medications? N  Managing your Finances? N  Housekeeping or managing your Housekeeping? N    Patient Care Team: Leamon Arnt, MD as PCP - General (Family Medicine)  Juanita Craver, MD as Consulting Physician (Gastroenterology)  Indicate any recent Medical Services you may have received from other than [facility] providers in the past year (date may be approximate).     Assessment:   This is a routine wellness examination for [place].  Hearing/Vision screen No results found.  Dietary issues and exercise activities discussed: Current Exercise Habits: Home exercise routine, Type of exercise: walking (reports chopping wood, maintaining garden, yard work, and Administrator 3 days a week), Time (Minutes): 50, Frequency (Times/Week): 5, Weekly Exercise (Minutes/Week): 250, Intensity: Mild, Exercise limited by: orthopedic condition(s)   Goals Addressed             This Visit's Progress    Patient Stated       Continue to eat healthy and stay active.      Depression Screen    09/06/2022   11:12 AM 09/02/2022    8:46 AM 08/24/2021   11:31 AM 09/04/2020    8:00 AM 05/25/2019    8:21 AM 02/12/2018    2:36 PM  PHQ 2/9 Scores  PHQ - 2 Score 0 0 0 0 0 0    Fall Risk    09/02/2022    8:46 AM 08/24/2021   11:32 AM 05/25/2019    8:21 AM 02/12/2018    2:36 PM  [place] in the past year? 0 0 0 No  Number falls in past yr: 0 0 0   Injury with Fall? 0 0 0   Risk for fall due to : No Fall Risks Impaired vision    Follow up Falls evaluation completed Falls prevention discussed Falls evaluation completed     FALL RISK PREVENTION PERTAINING TO THE HOME:  Any  stairs in or around the home? Yes  If so, are there any without handrails? Yes  Home free of loose throw rugs in walkways, pet beds, electrical cords, etc? Yes  Adequate lighting in your home to reduce risk of falls? Yes   ASSISTIVE DEVICES UTILIZED TO PREVENT FALLS:  Life alert? No  Use of a cane, walker or w/c? No  Grab bars in the bathroom? Yes  Shower chair or bench in shower? No  Elevated toilet seat or a handicapped toilet? No   Cognitive Function:        09/06/2022   10:58 AM 08/24/2021   11:34 AM  6CIT Screen  What Year? 0 points 0 points  What month? 0 points 0 points  What time? 0 points 0 points  Count back from 20 0 points 0 points  Months in reverse 0 points 0 points  Repeat phrase 0 points 0 points  Total Score 0 points 0 points    Immunizations Immunization History  Administered Date(s) Administered   Fluad Quad(high Dose 65+) 08/29/2021   Influenza Whole 09/09/2019   Influenza, High Dose Seasonal PF 09/14/2018   Pneumococcal Conjugate-13 03/16/2018   Pneumococcal Polysaccharide-23 05/25/2019   Tdap 08/14/2009, 05/25/2019   Zoster Recombinat (Shingrix) 08/21/2021, 02/07/2022   Zoster, Live 01/31/2014    TDAP status: Up to date  Flu Vaccine status: Due, Education has been provided regarding the importance of this vaccine. Advised may receive this vaccine at local pharmacy or Health Dept. Aware to provide a copy of the vaccination record if obtained from local pharmacy or Health Dept. Verbalized acceptance and understanding.  Pneumococcal vaccine status: Up to date  Covid-19 vaccine status: Information provided on how to obtain vaccines.   Qualifies for Shingles Vaccine? Yes  Zostavax completed No   Shingrix Completed?: Yes  Screening Tests Health Maintenance  Topic Date Due   COVID-19 Vaccine (1) 09/18/2022 (Originally 01/14/1956)   INFLUENZA VACCINE  02/23/2023 (Originally 06/25/2022)   COLONOSCOPY (Pts 45-25yr Insurance coverage will need to  be confirmed)  05/26/2023   TETANUS/TDAP  05/24/2029   Pneumonia Vaccine 71 Years old  Completed   Hepatitis C Screening  Completed   Zoster Vaccines- Shingrix  Completed   HPV VACCINES  Aged Out    Health Maintenance  There are no preventive care reminders to display for this patient.  Colorectal cancer screening: Type of screening: Colonoscopy. Completed 05/25/18. Repeat every 5 years  Lung Cancer Screening: (Low Dose CT Chest recommended if Age 69-80years, 30 pack-year currently smoking OR have quit w/in 15years.) does not qualify.   Additional Screening:  Hepatitis C Screening: does qualify; Completed 03/14/16  Vision Screening: Recommended annual ophthalmology exams for early detection of glaucoma and other disorders of the eye. Is the patient up to date with their annual eye exam?  Yes  Who is the provider or what is the name of the office in which the patient attends annual eye exams? Dr. TSatira SarkIf pt is not established with a provider, would they like to be referred to a provider to establish care?  N/A .   Dental Screening: Recommended annual dental exams for proper oral hygiene  Community Resource Referral / Chronic Care Management: CRR required this visit?  No   CCM required this visit?  No      Plan:     I have personally reviewed and noted the following in the patient's chart:   Medical and social history Use of alcohol, tobacco or illicit drugs  Current medications and supplements including opioid prescriptions. Patient is not currently taking opioid prescriptions. Functional ability and status Nutritional status Physical activity Advanced directives List of other physicians Hospitalizations, surgeries, and ER visits in previous 12 months Vitals Screenings to include cognitive, depression, and falls Referrals and appointments  In addition, I have reviewed and discussed with patient certain preventive protocols, quality metrics, and best practice  recommendations. A written personalized care plan for preventive services as well as general preventive health recommendations were provided to patient.     JMichiel Cowboy RN   09/06/2022   Nurse Notes:  Mr. RMateo, Thank you for taking time to come for your Medicare Wellness Visit. I appreciate your ongoing commitment to your health goals. Please review the following plan we discussed and let me know if I can assist you in the future.   These are the goals we discussed:  Goals      Patient Stated     Staying healthy and eat right      Patient Stated     Continue to eat healthy and stay active.        This is a list of the screening recommended for you and due dates:  Health Maintenance  Topic Date Due   COVID-19 Vaccine (1) 09/18/2022*   Flu Shot  02/23/2023*   Colon Cancer Screening  05/26/2023   Tetanus Vaccine  05/24/2029   Pneumonia Vaccine  Completed   Hepatitis C Screening: USPSTF Recommendation to screen - Ages 18-79 yo.  Completed   Zoster (Shingles) Vaccine  Completed   HPV Vaccine  Aged Out  *Topic was postponed. The date shown is not the original due date.

## 2022-09-05 NOTE — Patient Instructions (Signed)
Health Maintenance, Male Adopting a healthy lifestyle and getting preventive care are important in promoting health and wellness. Ask your health care provider about: The right schedule for you to have regular tests and exams. Things you can do on your own to prevent diseases and keep yourself healthy. What should I know about diet, weight, and exercise? Eat a healthy diet  Eat a diet that includes plenty of vegetables, fruits, low-fat dairy products, and lean protein. Do not eat a lot of foods that are high in solid fats, added sugars, or sodium. Maintain a healthy weight Body mass index (BMI) is a measurement that can be used to identify possible weight problems. It estimates body fat based on height and weight. Your health care provider can help determine your BMI and help you achieve or maintain a healthy weight. Get regular exercise Get regular exercise. This is one of the most important things you can do for your health. Most adults should: Exercise for at least 150 minutes each week. The exercise should increase your heart rate and make you sweat (moderate-intensity exercise). Do strengthening exercises at least twice a week. This is in addition to the moderate-intensity exercise. Spend less time sitting. Even light physical activity can be beneficial. Watch cholesterol and blood lipids Have your blood tested for lipids and cholesterol at 69 years of age, then have this test every 5 years. You may need to have your cholesterol levels checked more often if: Your lipid or cholesterol levels are high. You are older than 69 years of age. You are at high risk for heart disease. What should I know about cancer screening? Many types of cancers can be detected early and may often be prevented. Depending on your health history and family history, you may need to have cancer screening at various ages. This may include screening for: Colorectal cancer. Prostate cancer. Skin cancer. Lung  cancer. What should I know about heart disease, diabetes, and high blood pressure? Blood pressure and heart disease High blood pressure causes heart disease and increases the risk of stroke. This is more likely to develop in people who have high blood pressure readings or are overweight. Talk with your health care provider about your target blood pressure readings. Have your blood pressure checked: Every 3-5 years if you are 18-39 years of age. Every year if you are 40 years old or older. If you are between the ages of 65 and 75 and are a current or former smoker, ask your health care provider if you should have a one-time screening for abdominal aortic aneurysm (AAA). Diabetes Have regular diabetes screenings. This checks your fasting blood sugar level. Have the screening done: Once every three years after age 45 if you are at a normal weight and have a low risk for diabetes. More often and at a younger age if you are overweight or have a high risk for diabetes. What should I know about preventing infection? Hepatitis B If you have a higher risk for hepatitis B, you should be screened for this virus. Talk with your health care provider to find out if you are at risk for hepatitis B infection. Hepatitis C Blood testing is recommended for: Everyone born from 1945 through 1965. Anyone with known risk factors for hepatitis C. Sexually transmitted infections (STIs) You should be screened each year for STIs, including gonorrhea and chlamydia, if: You are sexually active and are younger than 69 years of age. You are older than 69 years of age and your   health care provider tells you that you are at risk for this type of infection. Your sexual activity has changed since you were last screened, and you are at increased risk for chlamydia or gonorrhea. Ask your health care provider if you are at risk. Ask your health care provider about whether you are at high risk for HIV. Your health care provider  may recommend a prescription medicine to help prevent HIV infection. If you choose to take medicine to prevent HIV, you should first get tested for HIV. You should then be tested every 3 months for as long as you are taking the medicine. Follow these instructions at home: Alcohol use Do not drink alcohol if your health care provider tells you not to drink. If you drink alcohol: Limit how much you have to 0-2 drinks a day. Know how much alcohol is in your drink. In the [place] one drink equals one 12 oz bottle of beer (355 mL), one 5 oz glass of Anedra Penafiel (148 mL), or one 1 oz glass of hard liquor (44 mL). Lifestyle Do not use any products that contain nicotine or tobacco. These products include cigarettes, chewing tobacco, and vaping devices, such as e-cigarettes. If you need help quitting, ask your health care provider. Do not use street drugs. Do not share needles. Ask your health care provider for help if you need support or information about quitting drugs. General instructions Schedule regular health, dental, and eye exams. Stay current with your vaccines. Tell your health care provider if: You often feel depressed. You have ever been abused or do not feel safe at home. Summary Adopting a healthy lifestyle and getting preventive care are important in promoting health and wellness. Follow your health care provider's instructions about healthy diet, exercising, and getting tested or screened for diseases. Follow your health care provider's instructions on monitoring your cholesterol and blood pressure. This information is not intended to replace advice given to you by your health care provider. Make sure you discuss any questions you have with your health care provider. Document Revised: 04/02/2021 Document Reviewed: 04/02/2021 Elsevier Patient Education  2023 [employer] Inc[employer]  

## 2022-09-06 ENCOUNTER — Ambulatory Visit (INDEPENDENT_AMBULATORY_CARE_PROVIDER_SITE_OTHER): Payer: Medicare HMO | Admitting: *Deleted

## 2022-09-06 ENCOUNTER — Ambulatory Visit: Payer: Medicare HMO

## 2022-09-06 DIAGNOSIS — Z0000 Encounter for general adult medical examination without abnormal findings: Secondary | ICD-10-CM

## 2022-10-07 DIAGNOSIS — H2513 Age-related nuclear cataract, bilateral: Secondary | ICD-10-CM | POA: Diagnosis not present

## 2022-10-07 DIAGNOSIS — R7303 Prediabetes: Secondary | ICD-10-CM | POA: Diagnosis not present

## 2022-10-07 DIAGNOSIS — H25013 Cortical age-related cataract, bilateral: Secondary | ICD-10-CM | POA: Diagnosis not present

## 2022-10-07 DIAGNOSIS — D3132 Benign neoplasm of left choroid: Secondary | ICD-10-CM | POA: Diagnosis not present

## 2022-10-07 DIAGNOSIS — H5203 Hypermetropia, bilateral: Secondary | ICD-10-CM | POA: Diagnosis not present

## 2022-10-07 DIAGNOSIS — H524 Presbyopia: Secondary | ICD-10-CM | POA: Diagnosis not present

## 2022-10-07 LAB — HM DIABETES EYE EXAM

## 2022-10-08 ENCOUNTER — Encounter: Payer: Self-pay | Admitting: Family Medicine

## 2022-11-11 DIAGNOSIS — D2371 Other benign neoplasm of skin of right lower limb, including hip: Secondary | ICD-10-CM | POA: Diagnosis not present

## 2022-11-11 DIAGNOSIS — D2261 Melanocytic nevi of right upper limb, including shoulder: Secondary | ICD-10-CM | POA: Diagnosis not present

## 2022-11-11 DIAGNOSIS — L738 Other specified follicular disorders: Secondary | ICD-10-CM | POA: Diagnosis not present

## 2022-11-11 DIAGNOSIS — Z85828 Personal history of other malignant neoplasm of skin: Secondary | ICD-10-CM | POA: Diagnosis not present

## 2022-11-11 DIAGNOSIS — D1801 Hemangioma of skin and subcutaneous tissue: Secondary | ICD-10-CM | POA: Diagnosis not present

## 2022-11-11 DIAGNOSIS — L814 Other melanin hyperpigmentation: Secondary | ICD-10-CM | POA: Diagnosis not present

## 2022-11-11 DIAGNOSIS — L821 Other seborrheic keratosis: Secondary | ICD-10-CM | POA: Diagnosis not present

## 2022-11-14 ENCOUNTER — Other Ambulatory Visit: Payer: Self-pay | Admitting: Family Medicine

## 2022-11-15 ENCOUNTER — Other Ambulatory Visit: Payer: Self-pay | Admitting: Family Medicine

## 2022-12-13 ENCOUNTER — Other Ambulatory Visit: Payer: Self-pay | Admitting: Family Medicine

## 2023-02-10 DIAGNOSIS — D3132 Benign neoplasm of left choroid: Secondary | ICD-10-CM | POA: Diagnosis not present

## 2023-02-10 DIAGNOSIS — H3562 Retinal hemorrhage, left eye: Secondary | ICD-10-CM | POA: Diagnosis not present

## 2023-03-17 ENCOUNTER — Encounter: Payer: Self-pay | Admitting: Family Medicine

## 2023-03-17 ENCOUNTER — Ambulatory Visit (INDEPENDENT_AMBULATORY_CARE_PROVIDER_SITE_OTHER): Payer: Medicare HMO | Admitting: Family Medicine

## 2023-03-17 VITALS — BP 132/78 | HR 64 | Temp 98.9°F | Ht 71.0 in | Wt 236.4 lb

## 2023-03-17 DIAGNOSIS — R609 Edema, unspecified: Secondary | ICD-10-CM | POA: Diagnosis not present

## 2023-03-17 DIAGNOSIS — I10 Essential (primary) hypertension: Secondary | ICD-10-CM | POA: Diagnosis not present

## 2023-03-17 NOTE — Progress Notes (Signed)
Subjective  CC:  Chief Complaint  Patient presents with   Hypertension    HPI: Rodney Pineda is a 72 y.o. male who presents to the office today to address the problems listed above in the chief complaint. Longstanding whitecoat hypertension f/u: Control is fair . Pt reports he is doing well. taking medications as instructed, no medication side effects noted, no TIAs, no chest pain on exertion, no dyspnea on exertion, no swelling of ankles.  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  He admits that at home when he is taking his blood pressure readings he can feel his anxiety rise in his chest.  Home readings however continue to be fairly well-controlled although at times they are 130s over 80s.  He denies adverse effects from his BP medications. Compliance with medication is good.  I reviewed medication list, he takes Lasix rarely now for lower extremity edema.  He is on amlodipine 5 mg daily.  Has never been on 10 mg.  His Toprol-XL 100 and lisinopril 40 have been constant for several years.  He has been on HCTZ in the past but has an allergy listed as facial numbness, the patient cannot recall details around that.  Assessment  1. White coat syndrome with diagnosis of hypertension   2. Dependent edema      Plan   Hypertension f/u: BP control is fairly well controlled.  He will start home blood pressure monitoring more frequently again and send me records.  Blood pressures have been well-controlled.  Will continue current regimen.  Consider trying hydrochlorothiazide again to see if he has a true allergy or not or increasing amlodipine to 10 mg if needed.  Patient will let me know how his blood pressures are doing.  He feels well.  No changes made today. Dependent edema: Well-controlled with rare Lasix use at this time.  Education regarding management of these chronic disease states was given.  Management strategies discussed on successive visits include dietary and exercise recommendations, goals of achieving and maintaining IBW, and lifestyle modifications aiming for adequate sleep and minimizing stressors.   Follow up: 6 months for complete physical  No orders of the defined types were placed in this encounter.  No orders of the defined types were placed in this encounter.     BP Readings from Last 3 Encounters:  03/17/23 132/78  09/02/22 124/72  08/02/22 (!) 145/79   Wt Readings from Last 3 Encounters:  03/17/23 236 lb 6.4 oz (107.2 kg)  09/02/22 236 lb 12.8 oz (107.4 kg)  08/02/22 234 lb (106.1 kg)    Lab Results  Component Value Date   CHOL 145 09/02/2022   CHOL 156 08/30/2021   CHOL 164 09/04/2020   Lab Results  Component Value Date   HDL 48.40 09/02/2022   HDL 43.60 08/30/2021   HDL 46 09/04/2020   Lab Results  Component Value Date   LDLCALC 72 09/02/2022   LDLCALC 75 08/30/2021   LDLCALC 94 09/04/2020   Lab Results  Component Value Date   TRIG 123.0 09/02/2022   TRIG 185.0 (H) 08/30/2021   TRIG 140 09/04/2020   Lab Results  Component Value Date   CHOLHDL 3 09/02/2022   CHOLHDL 4 08/30/2021   CHOLHDL 3.6 09/04/2020   Lab Results  Component Value Date   LDLDIRECT 84.0 03/16/2018   Lab Results  Component Value Date   CREATININE  0.94 09/02/2022   BUN 16 09/02/2022   NA 139 09/02/2022   K 4.2 09/02/2022   CL 104 09/02/2022   CO2 26 09/02/2022    The 10-year ASCVD risk score (Arnett DK, et al., 2019) is: 19.1%   Values used to calculate the score:     Age: 25 years     Sex: Male     Is Non-Hispanic African American: No     Diabetic: No     Tobacco smoker: No     Systolic Blood Pressure: 132 mmHg     Is BP treated: Yes     HDL Cholesterol: 48.4 mg/dL     Total Cholesterol: 145 mg/dL  I reviewed the patients updated PMH, FH, and SocHx.    Patient Active Problem List   Diagnosis Date Noted   Dependent edema 02/28/2021   Basal  cell carcinoma (BCC) of skin of left upper extremity including shoulder 03/12/2017   Colon polyps 05/01/2015   Family history of colon cancer 01/31/2014   IFG (impaired fasting glucose) 05/07/2011   Acquired hypothyroidism 01/28/2011   White coat syndrome with diagnosis of hypertension 01/28/2011   Mixed hyperlipidemia 01/28/2011   Osteoarthritis, 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   Allergic rhinitis 07/11/2010   BMI 30.0-30.9,adult 04/17/2010    Allergies: Hydrochlorothiazide and Niacin and related  Social History: Patient  reports that he has never smoked. He has never used smokeless tobacco. He reports that he does not drink alcohol and does not use drugs.  Current Meds  Medication Sig   amLODipine (NORVASC) 5 MG tablet TAKE 1 TABLET BY MOUTH DAILY   B Complex Vitamins (B COMPLEX-B12) TABS    Cholecalciferol (D3 2000) 50 MCG (2000 UT) CAPS    Coenzyme Q10 300 MG CAPS    diclofenac (VOLTAREN) 75 MG EC tablet Take 1 tablet (75 mg total) by mouth 2 (two) times daily as needed.   diclofenac sodium (VOLTAREN) 1 % GEL Apply 2 g topically 4 (four) times daily as needed.   fish oil-omega-3 fatty acids 1000 MG capsule Take 1 g by mouth 2 (two) times daily.   Flaxseed, Linseed, (FLAX SEEDS PO) Take by mouth. With omega 3   furosemide (LASIX) 20 MG tablet Take 1 tablet (20 mg total) by mouth daily as needed.   GARLIC PO Take 1 tablet by mouth daily.   levothyroxine (SYNTHROID) 137 MCG tablet TAKE 1 TABLET BY MOUTH EVERY DAY   lisinopril (ZESTRIL) 40 MG tablet TAKE 1 TABLET(40 MG) BY MOUTH DAILY   metoprolol succinate (TOPROL-XL) 100 MG 24 hr tablet TAKE 1 TABLET(100 MG) BY MOUTH DAILY WITH OR IMMEDIATELY FOLLOWING A MEAL   Red Yeast Rice Extract (RED YEAST RICE PO) Take by mouth.   rosuvastatin (CRESTOR) 20 MG tablet TAKE 1 TABLET(20 MG) BY MOUTH AT BEDTIME    Review of Systems: Cardiovascular: negative for chest pain, palpitations, leg swelling, orthopnea Respiratory: negative for SOB, wheezing or  persistent cough Gastrointestinal: negative for abdominal pain Genitourinary: negative for dysuria or gross hematuria  Objective  Vitals: BP 132/78 Comment: avg home readings  Pulse 64   Temp 98.9 F (37.2 C)   Ht  (1.803 m)   Wt 236 lb 6.4 oz (107.2 kg)   SpO2 97%   BMI 32.97 kg/m  General: no acute distress  Psych:  Alert and oriented, normal mood and affect HEENT:  Normocephalic, atraumatic, supple neck  Cardiovascular:  RRR without murmur. no edema Respiratory:  Good breath sounds bilaterally, CTAB with normal respiratory  effort Skin:  Warm, no rashes Neurologic:   Mental status is normal Commons side effects, risks, benefits, and alternatives for medications and treatment plan prescribed today were discussed, and the patient expressed understanding of the given instructions. Patient is instructed to call or message via MyChart if he/she has any questions or concerns regarding our treatment plan. No barriers to understanding were identified. We discussed Red Flag symptoms and signs in detail. Patient expressed understanding regarding what to do in case of urgent or emergency type symptoms.  Medication list was reconciled, printed and provided to the patient in AVS. Patient instructions and summary information was reviewed with the patient as documented in the AVS. This note was prepared with assistance of Dragon voice recognition software. Occasional wrong-word or sound-a-like substitutions may have occurred due to the inherent limitation

## 2023-03-17 NOTE — Patient Instructions (Signed)
Please return in 6 months for your annual complete physical; please come fasting.   Continue your current symptoms and checking your blood pressures more frequently at home.  Please send me a message if they are elevating.  We can increase the dose of your amlodipine in the future if needed.  Have a great summer!  If you have any questions or concerns, please don't hesitate to send me a message via MyChart or call the office at [phone]. Thank you for visiting with [place] today! It's our pleasure caring for you.

## 2023-03-24 DIAGNOSIS — H25813 Combined forms of age-related cataract, bilateral: Secondary | ICD-10-CM | POA: Diagnosis not present

## 2023-03-24 DIAGNOSIS — H3562 Retinal hemorrhage, left eye: Secondary | ICD-10-CM | POA: Diagnosis not present

## 2023-03-24 DIAGNOSIS — D3131 Benign neoplasm of right choroid: Secondary | ICD-10-CM | POA: Diagnosis not present

## 2023-04-09 ENCOUNTER — Other Ambulatory Visit: Payer: Self-pay | Admitting: Family Medicine

## 2023-05-02 DIAGNOSIS — D3131 Benign neoplasm of right choroid: Secondary | ICD-10-CM | POA: Diagnosis not present

## 2023-05-02 DIAGNOSIS — H318 Other specified disorders of choroid: Secondary | ICD-10-CM | POA: Diagnosis not present

## 2023-05-02 DIAGNOSIS — H3562 Retinal hemorrhage, left eye: Secondary | ICD-10-CM | POA: Diagnosis not present

## 2023-05-02 DIAGNOSIS — H25813 Combined forms of age-related cataract, bilateral: Secondary | ICD-10-CM | POA: Diagnosis not present

## 2023-06-05 DIAGNOSIS — Z86010 Personal history of colonic polyps: Secondary | ICD-10-CM | POA: Diagnosis not present

## 2023-06-05 DIAGNOSIS — K5730 Diverticulosis of large intestine without perforation or abscess without bleeding: Secondary | ICD-10-CM | POA: Diagnosis not present

## 2023-06-05 DIAGNOSIS — Z1211 Encounter for screening for malignant neoplasm of colon: Secondary | ICD-10-CM | POA: Diagnosis not present

## 2023-06-05 DIAGNOSIS — Z800 Family history of malignant neoplasm of digestive organs: Secondary | ICD-10-CM | POA: Diagnosis not present

## 2023-06-05 DIAGNOSIS — K219 Gastro-esophageal reflux disease without esophagitis: Secondary | ICD-10-CM | POA: Diagnosis not present

## 2023-07-04 DIAGNOSIS — Z86010 Personal history of colonic polyps: Secondary | ICD-10-CM | POA: Diagnosis not present

## 2023-07-04 DIAGNOSIS — K5730 Diverticulosis of large intestine without perforation or abscess without bleeding: Secondary | ICD-10-CM | POA: Diagnosis not present

## 2023-07-04 DIAGNOSIS — Z1211 Encounter for screening for malignant neoplasm of colon: Secondary | ICD-10-CM | POA: Diagnosis not present

## 2023-07-04 DIAGNOSIS — Z09 Encounter for follow-up examination after completed treatment for conditions other than malignant neoplasm: Secondary | ICD-10-CM | POA: Diagnosis not present

## 2023-07-04 DIAGNOSIS — Z800 Family history of malignant neoplasm of digestive organs: Secondary | ICD-10-CM | POA: Diagnosis not present

## 2023-07-04 DIAGNOSIS — K648 Other hemorrhoids: Secondary | ICD-10-CM | POA: Diagnosis not present

## 2023-08-11 DIAGNOSIS — D3131 Benign neoplasm of right choroid: Secondary | ICD-10-CM | POA: Diagnosis not present

## 2023-08-11 DIAGNOSIS — H3562 Retinal hemorrhage, left eye: Secondary | ICD-10-CM | POA: Diagnosis not present

## 2023-08-11 DIAGNOSIS — H2513 Age-related nuclear cataract, bilateral: Secondary | ICD-10-CM | POA: Diagnosis not present

## 2023-09-12 ENCOUNTER — Encounter: Payer: Self-pay | Admitting: Family Medicine

## 2023-09-12 ENCOUNTER — Ambulatory Visit (INDEPENDENT_AMBULATORY_CARE_PROVIDER_SITE_OTHER): Payer: Medicare HMO | Admitting: Family Medicine

## 2023-09-12 VITALS — BP 119/70 | HR 62 | Temp 98.2°F | Ht 71.0 in | Wt 238.2 lb

## 2023-09-12 DIAGNOSIS — E782 Mixed hyperlipidemia: Secondary | ICD-10-CM | POA: Diagnosis not present

## 2023-09-12 DIAGNOSIS — I10 Essential (primary) hypertension: Secondary | ICD-10-CM

## 2023-09-12 DIAGNOSIS — R7301 Impaired fasting glucose: Secondary | ICD-10-CM | POA: Diagnosis not present

## 2023-09-12 DIAGNOSIS — R609 Edema, unspecified: Secondary | ICD-10-CM

## 2023-09-12 DIAGNOSIS — Z0000 Encounter for general adult medical examination without abnormal findings: Secondary | ICD-10-CM | POA: Diagnosis not present

## 2023-09-12 DIAGNOSIS — M19042 Primary osteoarthritis, left hand: Secondary | ICD-10-CM | POA: Diagnosis not present

## 2023-09-12 DIAGNOSIS — E039 Hypothyroidism, unspecified: Secondary | ICD-10-CM

## 2023-09-12 DIAGNOSIS — Z800 Family history of malignant neoplasm of digestive organs: Secondary | ICD-10-CM

## 2023-09-12 DIAGNOSIS — M19041 Primary osteoarthritis, right hand: Secondary | ICD-10-CM | POA: Diagnosis not present

## 2023-09-12 DIAGNOSIS — Z0001 Encounter for general adult medical examination with abnormal findings: Secondary | ICD-10-CM

## 2023-09-12 LAB — COMPREHENSIVE METABOLIC PANEL
ALT: 14 U/L (ref 0–53)
AST: 16 U/L (ref 0–37)
Albumin: 4 g/dL (ref 3.5–5.2)
Alkaline Phosphatase: 58 U/L (ref 39–117)
BUN: 17 mg/dL (ref 6–23)
CO2: 28 meq/L (ref 19–32)
Calcium: 9.3 mg/dL (ref 8.4–10.5)
Chloride: 105 meq/L (ref 96–112)
Creatinine, Ser: 0.95 mg/dL (ref 0.40–1.50)
GFR: 81.01 mL/min (ref >60.00)
Glucose, Bld: 116 mg/dL — ABNORMAL HIGH (ref 70–99)
Potassium: 4.3 meq/L (ref 3.5–5.1)
Sodium: 139 meq/L (ref 135–145)
Total Bilirubin: 0.6 mg/dL (ref 0.2–1.2)
Total Protein: 6.7 g/dL (ref 6.0–8.3)

## 2023-09-12 LAB — LIPID PANEL
Cholesterol: 148 mg/dL (ref 0–200)
HDL: 40.9 mg/dL (ref >39.00)
LDL Cholesterol: 63 mg/dL (ref 0–99)
NonHDL: 107.55
Total CHOL/HDL Ratio: 4
Triglycerides: 221 mg/dL — ABNORMAL HIGH (ref 0.0–149.0)
VLDL: 44.2 mg/dL — ABNORMAL HIGH (ref 0.0–40.0)

## 2023-09-12 LAB — CBC WITH DIFFERENTIAL/PLATELET
Basophils Absolute: 0 10*3/uL (ref 0.0–0.1)
Basophils Relative: 0.7 % (ref 0.0–3.0)
Eosinophils Absolute: 0.3 10*3/uL (ref 0.0–0.7)
Eosinophils Relative: 4 % (ref 0.0–5.0)
HCT: 45.1 % (ref 39.0–52.0)
Hemoglobin: 14.6 g/dL (ref 13.0–17.0)
Lymphocytes Relative: 34.7 % (ref 12.0–46.0)
Lymphs Abs: 2.2 10*3/uL (ref 0.7–4.0)
MCHC: 32.3 g/dL (ref 30.0–36.0)
MCV: 88.6 fL (ref 78.0–100.0)
Monocytes Absolute: 0.4 10*3/uL (ref 0.1–1.0)
Monocytes Relative: 6.7 % (ref 3.0–12.0)
Neutro Abs: 3.4 10*3/uL (ref 1.4–7.7)
Neutrophils Relative %: 53.9 % (ref 43.0–77.0)
Platelets: 201 10*3/uL (ref 150.0–400.0)
RBC: 5.09 Mil/uL (ref 4.22–5.81)
RDW: 13.1 % (ref 11.5–15.5)
WBC: 6.3 10*3/uL (ref 4.0–10.5)

## 2023-09-12 LAB — HEMOGLOBIN A1C: Hgb A1c MFr Bld: 6 % (ref 4.6–6.5)

## 2023-09-12 LAB — TSH: TSH: 1.21 u[IU]/mL (ref 0.35–5.50)

## 2023-09-12 NOTE — Progress Notes (Signed)
Subjective  Chief Complaint  Patient presents with   Annual Exam    Pt here for Annual Exam and is currently fasting    Hypertension   Hyperlipidemia   Hypothyroidism    HPI: Rodney Pineda is a 72 y.o. male who presents to [facility] [place] today for a Male Wellness Visit. He also has the concerns and/or needs as listed above in the chief complaint. These will be addressed in addition to the Health Maintenance Visit.   Wellness Visit: annual visit with health maintenance review and exam   HM: had colonoscopy in August.  It looks good and he will be due in 5 years for repeat due to family history of colon cancer.  He is doing very well.  Still working 3 days a week.  Active and feels well.  Recently returned from [place].  He and his wife are deferring the flu shot for a few more weeks.  Education given.  Eye exam current.  Body mass index is 33.22 kg/m. Wt Readings from Last 3 Encounters:  09/12/23 238 lb 3.2 oz (108 kg)  03/17/23 236 lb 6.4 oz (107.2 kg)  09/02/22 236 lb 12.8 oz (107.4 kg)     Chronic disease management visit and/or acute problem visit: Hypertension with whitecoat component: Home blood pressure readings are excellent, averaging 120s over 60s to 70s.  Sometimes even lower.  He is compliant with his medications.  No chest pain, palpitations or shortness of breath. No extremity edema: This has been very well-controlled with diet and exercise.  Rarely needing Lasix Hyperlipidemia on statin fasting for recheck.  This has been controlled Hypothyroidism compliant with medication and feels well Osteoarthritis: Back hands sometimes knees.  But overall doing very well.  Occasionally uses Voltaren.  Assessment  1. Encounter for well adult exam with abnormal findings   2. Acquired hypothyroidism   3. White coat syndrome with diagnosis of hypertension   4. Mixed hyperlipidemia   5. IFG (impaired fasting glucose)   6. Family history of colon cancer    7. Dependent edema   8. Primary osteoarthritis of both hands      Plan  Male Wellness Visit: Age appropriate Health Maintenance and Prevention measures were discussed with patient. Included topics are cancer screening recommendations, ways to keep healthy (see AVS) including dietary and exercise recommendations, regular eye and dental care, use of seat belts, and avoidance of moderate alcohol use and tobacco use.  Screens are current BMI: discussed patient's BMI and encouraged positive lifestyle modifications to help get to or maintain a target BMI. HM needs and immunizations were addressed and ordered. See below for orders. See HM and immunization section for updates.  Recommend flu shot Routine labs and screening tests ordered including cmp, cbc and lipids where appropriate. Discussed recommendations regarding Vit D and calcium supplementation (see AVS)  Chronic disease f/u and/or acute problem visit: (deemed necessary to be done in addition to the wellness visit): Blood pressure is well-controlled: Continue home readings.  Continue metoprolol XL 100, lisinopril 40 and amlodipine 5.  Check renal function electrolytes.  HLD: on crestor 20 mg nightly.  Occasional myalgias.  Fasting for recheck. History of impaired fasting glucose: Continues to work on diet weight is stable.  Check A1c no symptoms of hyperglycemia Hypothyroidism on levothyroxine 137 mcg daily and he appears clinically euthyroid.  Recheck today  Follow up: 6 months for blood pressure recheck Commons side effects, risks, benefits, and alternatives for medications and treatment plan  prescribed today were discussed, and the patient expressed understanding of the given instructions. Patient is instructed to call or message via MyChart if he/she has any questions or concerns regarding our treatment plan. No barriers to understanding were identified. We discussed Red Flag symptoms and signs in detail. Patient expressed understanding  regarding what to do in case of urgent or emergency type symptoms.  Medication list was reconciled, printed and provided to the patient in AVS. Patient instructions and summary information was reviewed with the patient as documented in the AVS. This note was prepared with assistance of Dragon voice recognition software. Occasional wrong-word or sound-a-like substitutions may have occurred due to the inherent limitations of voice recognition software  Orders Placed This Encounter  Procedures   CBC with Differential/Platelet   Comprehensive metabolic panel   Lipid panel   TSH   Hemoglobin A1c   No orders of the defined types were placed in this encounter.    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   Allergic rhinitis 07/11/2010   BMI 30.0-30.9,adult 04/17/2010   Health Maintenance  Topic Date Due   Medicare Annual Wellness (AWV)  09/07/2023   COVID-19 Vaccine (1) 09/28/2023 (Originally 01/14/1956)   INFLUENZA VACCINE  02/23/2024 (Originally 06/26/2023)   Colonoscopy  07/03/2028   DTaP/Tdap/Td (3 - Td or Tdap) 05/24/2029   Pneumonia Vaccine 47+ Years old  Completed   Hepatitis C Screening  Completed   Zoster Vaccines- Shingrix  Completed   HPV VACCINES  Aged Out   Immunization History  Administered Date(s) Administered   Fluad Quad(high Dose 65+) 08/29/2021   Influenza Whole 09/09/2019   Influenza, High Dose Seasonal PF 09/14/2018   Pneumococcal Conjugate-13 03/16/2018   Pneumococcal Polysaccharide-23 05/25/2019   Tdap 08/14/2009, 05/25/2019   Zoster  Recombinant(Shingrix) 08/21/2021, 02/07/2022   Zoster, Live 01/31/2014   We updated and reviewed the patient's past history in detail and it is documented below. Allergies: Patient is allergic to hydrochlorothiazide and niacin and related. Past Medical History  has a past medical history of Disease of thyroid gland, Diverticulitis, Family history of colon cancer, Hyperlipidemia, Hypertension, and Snake envenomation. Past Surgical History Patient  has a past surgical history that includes Appendectomy (1995) and Cholecystectomy (2010). Social History Patient  reports that he has never smoked. He has never used smokeless tobacco. He reports that he does not drink alcohol and does not use drugs. Family History family history includes Cancer in his maternal grandmother and mother; Colon cancer in his maternal aunt and mother; Early death in his sister; Heart disease in his sister; Hyperlipidemia in his father; Hypertension in his brother, brother, brother, father, sister, sister, sister, sister, and son; Lung cancer in his maternal aunt; Stroke in his paternal grandfather. Review of Systems: Constitutional: negative for fever or malaise Ophthalmic: negative for photophobia, double vision or loss of vision Cardiovascular: negative for chest pain, dyspnea on exertion, or new LE swelling Respiratory: negative for SOB or persistent cough Gastrointestinal: negative for abdominal pain, change in bowel habits or melena Genitourinary: negative for dysuria or gross hematuria Musculoskeletal: negative for new gait disturbance or muscular weakness  Integumentary: negative for new or persistent rashes Neurological: negative for TIA or stroke symptoms Psychiatric: negative for SI or delusions Allergic/Immunologic: negative for hives  Patient Care Team    Relationship Specialty Notifications Start End  Willow Ora, MD PCP - General Family Medicine  03/01/13   Charna Elizabeth, MD Consulting Physician  Gastroenterology  03/16/18    Objective  Vitals: BP 119/70 Comment: by consistent home readings  Pulse 62   Temp 98.2 F (36.8 C)   Ht 5\' 11"  (1.803 m)   Wt 238 lb 3.2 oz (108 kg)   SpO2 97%   BMI 33.22 kg/m  General:  Well developed, well nourished, no acute distress  Psych:  Alert and orientedx3,normal mood and affect HEENT:  Normocephalic, atraumatic, non-icteric sclera,  oropharynx is clear without mass or exudate, supple neck without adenopathy, or thyromegaly Cardiovascular:  Normal S1, S2, RRR without gallop, rub or murmur,  Respiratory:  Good breath sounds bilaterally, CTAB with normal respiratory effort Gastrointestinal: normal bowel sounds, soft, non-tender, no noted masses. No HSM MSK: Joints are without erythema or swelling.  Skin:  Warm, no rashes Neurologic:    Mental status is normal.  Gross motor and sensory exams are normal. Stable gait. No tremor

## 2023-09-12 NOTE — Patient Instructions (Signed)

## 2023-09-13 NOTE — Progress Notes (Signed)
See mychart note Dear Mr. Roeth, Your lab results are stable. Cholesterol, kidney, liver function and thyroid are all normal or at goal. Your sugar test remains mildly elevated in the early prediabetic range, so keeping to a low sugar diet is important.  No changes are needed at this time.  Sincerely, Dr. Mardelle Matte

## 2023-09-15 ENCOUNTER — Ambulatory Visit (INDEPENDENT_AMBULATORY_CARE_PROVIDER_SITE_OTHER): Payer: Medicare HMO

## 2023-09-15 VITALS — Wt 238.0 lb

## 2023-09-15 DIAGNOSIS — Z0000 Encounter for general adult medical examination without abnormal findings: Secondary | ICD-10-CM

## 2023-09-15 NOTE — Patient Instructions (Signed)
Rodney Pineda , Thank you for taking time to come for your Medicare Wellness Visit. I appreciate your ongoing commitment to your health goals. Please review the following plan we discussed and let me know if I can assist you in the future.   Referrals/Orders/Follow-Ups/Clinician Recommendations: lose weight and maintain activity  Aim for 30 minutes of exercise or brisk walking, 6-8 glasses of water, and 5 servings of fruits and vegetables each day.    This is a list of the screening recommended for you and due dates:  Health Maintenance  Topic Date Due   COVID-19 Vaccine (1) 09/28/2023*   Flu Shot  02/23/2024*   Medicare Annual Wellness Visit  09/14/2024   Colon Cancer Screening  07/03/2028   DTaP/Tdap/Td vaccine (3 - Td or Tdap) 05/24/2029   Pneumonia Vaccine  Completed   Hepatitis C Screening  Completed   Zoster (Shingles) Vaccine  Completed   HPV Vaccine  Aged Out  *Topic was postponed. The date shown is not the original due date.    Advanced directives: (Copy Requested) Please bring a copy of your health care power of attorney and living will to the office to be added to your chart at your convenience.  Next Medicare Annual Wellness Visit scheduled for next year: Yes

## 2023-09-15 NOTE — Progress Notes (Signed)
Subjective:   Rodney Pineda is a 72 y.o. male who presents for Medicare Annual/Subsequent preventive examination.  Visit Complete: Virtual I connected with  Nuh Meeds on 09/15/23 by a audio enabled telemedicine application and verified that I am speaking with the correct person using two identifiers.  Patient Location: Home  Provider Location: Office/Clinic  I discussed the limitations of evaluation and management by telemedicine. The patient expressed understanding and agreed to proceed.  Vital Signs: Because this visit was a virtual/telehealth visit, some criteria may be missing or patient reported. Any vitals not documented were not able to be obtained and vitals that have been documented are patient reported.        Objective:    Today's Vitals   09/15/23 1303  Weight: 238 lb (108 kg)   Body mass index is 33.19 kg/m.     09/15/2023    1:07 PM 09/06/2022   10:57 AM 08/02/2022    6:35 PM 08/24/2021   11:32 AM  Advanced Directives  Does Patient Have a Medical Advance Directive? Yes No No Yes  Type of Estate agent of [place];Living will     Does patient want to make changes to medical advance directive?    Yes (MAU/Ambulatory/Procedural Areas - Information given)  Copy of Healthcare Power of Attorney in Chart? No - copy requested     Would patient like information on creating a medical advance directive?  No - Patient declined      Current Medications (verified) Outpatient Encounter Medications as of 09/15/2023  Medication Sig   amLODipine (NORVASC) 5 MG tablet TAKE 1 TABLET BY MOUTH DAILY   B Complex Vitamins (B COMPLEX-B12) TABS    Cholecalciferol (D3 2000) 50 MCG (2000 UT) CAPS    Coenzyme Q10 300 MG CAPS    diclofenac (VOLTAREN) 75 MG EC tablet Take 1 tablet (75 mg total) by mouth 2 (two) times daily as needed.   diclofenac sodium (VOLTAREN) 1 % GEL Apply 2 g topically 4 (four) times daily as needed.   fish oil-omega-3 fatty acids  1000 MG capsule Take 1 g by mouth 2 (two) times daily.   Flaxseed, Linseed, (FLAX SEEDS PO) Take by mouth. With omega 3   furosemide (LASIX) 20 MG tablet Take 1 tablet (20 mg total) by mouth daily as needed.   GARLIC PO Take 1 tablet by mouth daily.   levothyroxine (SYNTHROID) 137 MCG tablet TAKE 1 TABLET BY MOUTH EVERY DAY   lisinopril (ZESTRIL) 40 MG tablet TAKE 1 TABLET(40 MG) BY MOUTH DAILY   metoprolol succinate (TOPROL-XL) 100 MG 24 hr tablet TAKE 1 TABLET(100 MG) BY MOUTH DAILY WITH OR IMMEDIATELY FOLLOWING A MEAL   Red Yeast Rice Extract (RED YEAST RICE PO) Take by mouth.   rosuvastatin (CRESTOR) 20 MG tablet TAKE 1 TABLET(20 MG) BY MOUTH AT BEDTIME   No facility-administered encounter medications on file as of 09/15/2023.    Allergies (verified) Hydrochlorothiazide and Niacin and related   History: Past Medical History:  Diagnosis Date   Disease of thyroid gland    Diverticulitis    Family history of colon cancer    Mother and Mat Aunt, 47's   Hyperlipidemia    Hypertension    Snake envenomation    copper head, antevenome in ER   Past Surgical History:  Procedure Laterality Date   APPENDECTOMY  1995   CHOLECYSTECTOMY  2010   Family History  Problem Relation Age of Onset   Colon cancer Mother  Cancer Mother    Hypertension Father    Hyperlipidemia Father    Hypertension Sister    Early death Sister    Heart disease Sister    Hypertension Brother    Cancer Maternal Grandmother        unknown type   Colon cancer Maternal Aunt    Lung cancer Maternal Aunt    Hypertension Son    Stroke Paternal Grandfather    Hypertension Sister    Hypertension Sister    Hypertension Sister    Hypertension Brother    Hypertension Brother    Social History   Socioeconomic History   Marital status: Married    Spouse name: Consuella Lose   Number of children: 4   Years of education: 12   Highest education level: 12th grade  Occupational History   Not on file  Tobacco Use    Smoking status: Never   Smokeless tobacco: Never  Vaping Use   Vaping status: Never Used  Substance and Sexual Activity   Alcohol use: No   Drug use: Never   Sexual activity: Yes  Other Topics Concern   Not on file  Social History Narrative   Not on file   Social Determinants of Health   Financial Resource Strain: Low Risk  (09/15/2023)   Overall Financial Resource Strain (CARDIA)    Difficulty of Paying Living Expenses: Not hard at all  Food Insecurity: No Food Insecurity (09/15/2023)   Hunger Vital Sign    Worried About Running Out of Food in the Last Year: Never true    Ran Out of Food in the Last Year: Never true  Transportation Needs: No Transportation Needs (09/15/2023)   PRAPARE - Administrator, Civil Service (Medical): No    Lack of Transportation (Non-Medical): No  Physical Activity: Sufficiently Active (09/15/2023)   Exercise Vital Sign    Days of Exercise per Week: 4 days    Minutes of Exercise per Session: 60 min  Stress: No Stress Concern Present (09/15/2023)   [employer] of Occupational Health - Occupational Stress Questionnaire    Feeling of Stress : Not at all  Social Connections: Moderately Integrated (09/15/2023)   Social Connection and Isolation Panel [NHANES]    Frequency of Communication with Friends and Family: More than three times a week    Frequency of Social Gatherings with Friends and Family: More than three times a week    Attends Religious Services: 1 to 4 times per year    Active Member of [employer] or Organizations: No    Attends Engineer, structural: Never    Marital Status: Married    Tobacco Counseling Counseling given: Not Answered   Clinical Intake:  Pre-visit preparation completed: Yes  Pain : No/denies pain     BMI - recorded: 33.19 Nutritional Status: BMI > 30  Obese Nutritional Risks: None Diabetes: No  How often do you need to have someone help you when you read instructions, pamphlets, or  other written materials from your doctor or pharmacy?: 1 - Never  Interpreter Needed?: No  Information entered by :: Lanier Ensign, LPN   Activities of Daily Living     No data to display          Patient Care Team: Willow Ora, MD as PCP - General (Family Medicine) Charna Elizabeth, MD as Consulting Physician (Gastroenterology)  Indicate any recent Medical Services you may have received from other than [facility] providers in the past year (date may be  approximate).     Assessment:   This is a routine wellness examination for [place].  Hearing/Vision screen Hearing Screening - Comments:: Pt denies any hearing issues  Vision Screening - Comments:: Pt follows up with Dr Burgess Estelle for annual eye exams    Goals Addressed             This Visit's Progress    Patient Stated       Continue to lose weight        Depression Screen    09/15/2023    1:07 PM 09/12/2023    8:53 AM 03/17/2023    9:52 AM 09/06/2022   11:12 AM 09/02/2022    8:46 AM 08/24/2021   11:31 AM 09/04/2020    8:00 AM  PHQ 2/9 Scores  PHQ - 2 Score 0 0 0 0 0 0 0    Fall Risk    09/15/2023    1:08 PM 09/12/2023    8:53 AM 03/17/2023    9:52 AM 09/06/2022   11:16 AM 09/02/2022    8:46 AM  Fall Risk   Falls in the past year? 0 0 0 0 0  Number falls in past yr: 0 0 0 0 0  Injury with Fall? 0 0 0 0 0  Risk for fall due to : Impaired vision No Fall Risks No Fall Risks No Fall Risks No Fall Risks  Follow up Falls prevention discussed Falls evaluation completed Falls evaluation completed Falls evaluation completed Falls evaluation completed    MEDICARE RISK AT HOME: Medicare Risk at Home Any stairs in or around the home?: Yes If so, are there any without handrails?: No Home free of loose throw rugs in walkways, pet beds, electrical cords, etc?: Yes Adequate lighting in your home to reduce risk of falls?: Yes Life alert?: No Use of a cane, walker or w/c?: No Grab bars in the bathroom?: Yes Shower  chair or bench in shower?: No Elevated toilet seat or a handicapped toilet?: No  TIMED UP AND GO:  Was the test performed?  No    Cognitive Function:        09/06/2022   10:58 AM 08/24/2021   11:34 AM  6CIT Screen  What Year? 0 points 0 points  What month? 0 points 0 points  What time? 0 points 0 points  Count back from 20 0 points 0 points  Months in reverse 0 points 0 points  Repeat phrase 0 points 0 points  Total Score 0 points 0 points    Immunizations Immunization History  Administered Date(s) Administered   Fluad Quad(high Dose 65+) 08/29/2021   Influenza Whole 09/09/2019   Influenza, High Dose Seasonal PF 09/14/2018   Pneumococcal Conjugate-13 03/16/2018   Pneumococcal Polysaccharide-23 05/25/2019   Tdap 08/14/2009, 05/25/2019   Zoster Recombinant(Shingrix) 08/21/2021, 02/07/2022   Zoster, Live 01/31/2014    TDAP status: Up to date  Flu Vaccine status: Due, Education has been provided regarding the importance of this vaccine. Advised may receive this vaccine at local pharmacy or Health Dept. Aware to provide a copy of the vaccination record if obtained from local pharmacy or Health Dept. Verbalized acceptance and understanding.  Pneumococcal vaccine status: Up to date  Covid-19 vaccine status: Information provided on how to obtain vaccines.   Qualifies for Shingles Vaccine? Yes   Zostavax completed Yes   Shingrix Completed?: Yes  Screening Tests Health Maintenance  Topic Date Due   COVID-19 Vaccine (1) 09/28/2023 (Originally 01/14/1956)   INFLUENZA VACCINE  02/23/2024 (  Originally 06/26/2023)   Medicare Annual Wellness (AWV)  09/14/2024   Colonoscopy  07/03/2028   DTaP/Tdap/Td (3 - Td or Tdap) 05/24/2029   Pneumonia Vaccine 62+ Years old  Completed   Hepatitis C Screening  Completed   Zoster Vaccines- Shingrix  Completed   HPV VACCINES  Aged Out    Health Maintenance  There are no preventive care reminders to display for this patient.   Colorectal  cancer screening: Type of screening: Colonoscopy. Completed 07/04/23. Repeat every 5 years   Additional Screening:  Hepatitis C Screening: Completed 03/14/16  Vision Screening: Recommended annual ophthalmology exams for early detection of glaucoma and other disorders of the eye. Is the patient up to date with their annual eye exam?  Yes  Who is the provider or what is the name of the office in which the patient attends annual eye exams? Dr Burgess Estelle  If pt is not established with a provider, would they like to be referred to a provider to establish care? No .   Dental Screening: Recommended annual dental exams for proper oral hygiene   Community Resource Referral / Chronic Care Management: CRR required this visit?  No   CCM required this visit?  No     Plan:     I have personally reviewed and noted the following in the patient's chart:   Medical and social history Use of alcohol, tobacco or illicit drugs  Current medications and supplements including opioid prescriptions. Patient is not currently taking opioid prescriptions. Functional ability and status Nutritional status Physical activity Advanced directives List of other physicians Hospitalizations, surgeries, and ER visits in previous 12 months Vitals Screenings to include cognitive, depression, and falls Referrals and appointments  In addition, I have reviewed and discussed with patient certain preventive protocols, quality metrics, and best practice recommendations. A written personalized care plan for preventive services as well as general preventive health recommendations were provided to patient.     Marzella Schlein, LPN   [id number]   After Visit Summary: (MyChart) Due to this being a telephonic visit, the after visit summary with patients personalized plan was offered to patient via MyChart   Nurse Notes: none

## 2023-10-15 DIAGNOSIS — H5203 Hypermetropia, bilateral: Secondary | ICD-10-CM | POA: Diagnosis not present

## 2023-10-15 DIAGNOSIS — H43813 Vitreous degeneration, bilateral: Secondary | ICD-10-CM | POA: Diagnosis not present

## 2023-10-15 DIAGNOSIS — H52203 Unspecified astigmatism, bilateral: Secondary | ICD-10-CM | POA: Diagnosis not present

## 2023-10-15 DIAGNOSIS — H2513 Age-related nuclear cataract, bilateral: Secondary | ICD-10-CM | POA: Diagnosis not present

## 2023-10-15 DIAGNOSIS — H25013 Cortical age-related cataract, bilateral: Secondary | ICD-10-CM | POA: Diagnosis not present

## 2023-10-15 DIAGNOSIS — R7303 Prediabetes: Secondary | ICD-10-CM | POA: Diagnosis not present

## 2023-10-15 DIAGNOSIS — H524 Presbyopia: Secondary | ICD-10-CM | POA: Diagnosis not present

## 2023-10-15 DIAGNOSIS — H3562 Retinal hemorrhage, left eye: Secondary | ICD-10-CM | POA: Diagnosis not present

## 2023-11-12 DIAGNOSIS — D1801 Hemangioma of skin and subcutaneous tissue: Secondary | ICD-10-CM | POA: Diagnosis not present

## 2023-11-12 DIAGNOSIS — L821 Other seborrheic keratosis: Secondary | ICD-10-CM | POA: Diagnosis not present

## 2023-11-12 DIAGNOSIS — L570 Actinic keratosis: Secondary | ICD-10-CM | POA: Diagnosis not present

## 2023-11-12 DIAGNOSIS — L820 Inflamed seborrheic keratosis: Secondary | ICD-10-CM | POA: Diagnosis not present

## 2023-11-12 DIAGNOSIS — L814 Other melanin hyperpigmentation: Secondary | ICD-10-CM | POA: Diagnosis not present

## 2023-11-12 DIAGNOSIS — Z85828 Personal history of other malignant neoplasm of skin: Secondary | ICD-10-CM | POA: Diagnosis not present

## 2023-11-12 DIAGNOSIS — D485 Neoplasm of uncertain behavior of skin: Secondary | ICD-10-CM | POA: Diagnosis not present

## 2023-11-17 ENCOUNTER — Other Ambulatory Visit: Payer: Self-pay | Admitting: Family Medicine

## 2023-11-17 MED ORDER — METOPROLOL SUCCINATE ER 100 MG PO TB24: 100.0000 mg | ORAL_TABLET | Freq: Every day | ORAL | 3 refills | Status: DC

## 2023-11-17 MED ORDER — AMLODIPINE BESYLATE 5 MG PO TABS: 5.0000 mg | ORAL_TABLET | Freq: Every day | ORAL | 3 refills | Status: DC

## 2023-11-17 MED ORDER — LISINOPRIL 40 MG PO TABS: 40.0000 mg | ORAL_TABLET | Freq: Every day | ORAL | 3 refills | Status: DC

## 2023-11-17 MED ORDER — LEVOTHYROXINE SODIUM 137 MCG PO TABS: 137.0000 ug | ORAL_TABLET | Freq: Every day | ORAL | 3 refills | Status: DC

## 2023-11-17 NOTE — Telephone Encounter (Signed)
Copied from CRM [id number]. Topic: Clinical - Medication Refill >> Nov 17, 2023  2:40 PM Steele Sizer wrote: Most Recent Primary Care Visit:  Provider: Marzella Schlein  Department: [facility]  Visit Type: MEDICARE AWV, SEQUENTIAL  Date: 09/15/2023  Medication: amlodipine, levothyroxine   Has the patient contacted their pharmacy? Yes (Agent: If no, request that the patient contact the pharmacy for the refill. If patient does not wish to contact the pharmacy document the reason why and proceed with request.) (Agent: If yes, when and what did the pharmacy advise?)  Is this the correct pharmacy for this prescription? Yes If no, delete pharmacy and type the correct one.  This is the patient's preferred pharmacy:  [facility] [address] [place], [place] - [address] AT [facility] [address] [place] [address] Phone: [phone] Fax: [phone]   Has the prescription been filled recently? Yes  Is the patient out of the medication? No  Has the patient been seen for an appointment in the last year OR does the patient have an upcoming appointment?   Can we respond through MyChart?   Agent: Please be advised that Rx refills may take up to 3 business days. We ask that you follow-up with your pharmacy.

## 2023-11-28 ENCOUNTER — Ambulatory Visit (INDEPENDENT_AMBULATORY_CARE_PROVIDER_SITE_OTHER): Payer: Medicare HMO | Admitting: Family Medicine

## 2023-11-28 ENCOUNTER — Encounter: Payer: Self-pay | Admitting: Family Medicine

## 2023-11-28 VITALS — BP 154/95 | HR 60 | Temp 98.1°F | Ht 71.0 in

## 2023-11-28 DIAGNOSIS — M47816 Spondylosis without myelopathy or radiculopathy, lumbar region: Secondary | ICD-10-CM | POA: Insufficient documentation

## 2023-11-28 DIAGNOSIS — M545 Low back pain, unspecified: Secondary | ICD-10-CM | POA: Diagnosis not present

## 2023-11-28 DIAGNOSIS — M25562 Pain in left knee: Secondary | ICD-10-CM

## 2023-11-28 MED ORDER — DICLOFENAC SODIUM 75 MG PO TBEC: 75.0000 mg | DELAYED_RELEASE_TABLET | Freq: Two times a day (BID) | ORAL | 1 refills | Status: AC | PRN

## 2023-11-28 NOTE — Patient Instructions (Signed)
 Please follow up if symptoms do not improve or as needed.    VISIT SUMMARY:  During today's visit, we discussed your new knee pain, chronic low back pain, and intermittent neck pain. We reviewed your symptoms and developed a plan to help manage your pain and improve your overall comfort.  YOUR PLAN:  -KNEE PAIN: Your knee pain, which has been present for over a month, is likely due to arthritis and may be worsened by your altered gait from back pain. We have prescribed diclofenac  to be taken twice daily with meals for ten days, recommended using a knee brace when walking, advised against sleeping with the knee brace, suggested icing the knee, and recommended quad strengthening exercises (three sets of twelve straight leg raises daily). If your symptoms do not improve, we may need to order x-rays. Please follow up if there is no improvement.  -LOW BACK PAIN: Your chronic low back pain, which has recently worsened, is likely due to arthritis. The altered gait from your back pain may have contributed to your knee pain. Continue with your current management as needed.  -NECK PAIN: Your intermittent neck pain may be related to your posture or sleeping position. We discussed adjusting your sleeping position to help alleviate the symptoms. Please monitor your symptoms and make adjustments as needed.  INSTRUCTIONS:  Please follow up if your knee pain does not improve after following the prescribed treatment plan. If necessary, we will order x-rays to further investigate the issue.

## 2023-11-28 NOTE — Progress Notes (Signed)
 Subjective  CC:  Chief Complaint  Patient presents with   Knee Pain    Left knee pain for the past month. Knee seems to have gotten worse and it wakes him up in  the middle of the night.     HPI: Rodney Pineda is a 73 y.o. male who presents to the office today to address the problems listed above in the chief complaint. Discussed the use of AI scribe software for clinical note transcription with the patient, who gave verbal consent to proceed.  History of Present Illness   The patient, with a history of chronic low back pain, presents with new onset knee pain that started a little over a month ago. The pain is described as constant, worse after sitting for a while and upon standing up, and seems to improve with movement. The patient also reports limping more and feeling like the knee might give way at times. The pain is severe enough to cause moaning during sleep. The patient denies any swelling in the knee. The patient has been using Diclofenac  sporadically for the pain but reports no significant relief. The patient also reports a crick in the neck on the same side as the knee pain. There is no history of recent injuries or falls.     Assessment  1. Acute pain of left knee   2. Low back pain, episodic   3. Spondylosis of lumbar region without myelopathy or radiculopathy      Plan  Assessment and Plan    Knee Pain - likely OA and/or PFS Intermittent knee pain for over a month, worse after sitting and in the mornings. No significant swelling or heat. Pain exacerbated by walking and climbing stairs. Likely arthritis exacerbated by altered gait due to back pain. Discussed treatment options including diclofenac  and a steroid injection. Patient prefers oral anti-inflammatories and knee brace. X-rays may be needed if symptoms do not improve. - Prescribe diclofenac  twice daily with meals for ten days - Recommend use of a knee brace when ambulatory - Advise against sleeping with the knee  brace - Recommend icing the knee - Suggest quad strengthening exercises (three sets of twelve straight leg raises daily) - Order x-rays if symptoms do not improve - Follow-up if no improvement  Low Back Pain due to OA Chronic low back pain with recent exacerbation. Pain has improved but still present. Likely arthritis. Altered gait due to back pain may have contributed to knee pain. - Continue current management as needed  Neck Pain Intermittent neck pain, possibly related to altered posture or sleeping position. Discussed adjusting sleeping position to alleviate symptoms. - Monitor symptoms and adjust sleeping position as needed.      No orders of the defined types were placed in this encounter.  Meds ordered this encounter  Medications   diclofenac  (VOLTAREN ) 75 MG EC tablet    Sig: Take 1 tablet (75 mg total) by mouth 2 (two) times daily as needed.    Dispense:  60 tablet    Refill:  1     I reviewed the patients updated PMH, FH, and SocHx.    Patient Active Problem List   Diagnosis Date Noted   Spondylosis of lumbar region without myelopathy or radiculopathy 11/28/2023   Dependent edema 02/28/2021   Basal cell carcinoma (BCC) of skin of left upper extremity including shoulder 03/12/2017   Colon polyps 05/01/2015   Family history of colon cancer 01/31/2014   IFG (impaired fasting glucose) 05/07/2011   Acquired  hypothyroidism 01/28/2011   White coat syndrome with diagnosis of hypertension 01/28/2011   Mixed hyperlipidemia 01/28/2011   Osteoarthritis, 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   Allergic rhinitis 07/11/2010   BMI 30.0-30.9,adult 04/17/2010   Current Meds  Medication Sig   amLODipine  (NORVASC ) 5 MG tablet Take 1 tablet (5 mg total) by mouth daily.   B Complex Vitamins (B COMPLEX-B12) TABS    Cholecalciferol (D3 2000) 50 MCG (2000 UT) CAPS    Coenzyme Q10 300 MG CAPS    diclofenac  sodium (VOLTAREN ) 1 % GEL Apply 2 g topically 4 (four) times daily as needed.   fish oil-omega-3  fatty acids 1000 MG capsule Take 1 g by mouth 2 (two) times daily.   Flaxseed, Linseed, (FLAX SEEDS PO) Take by mouth. With omega 3   furosemide  (LASIX ) 20 MG tablet Take 1 tablet (20 mg total) by mouth daily as needed.   GARLIC PO Take 1 tablet by mouth daily.   levothyroxine  (SYNTHROID ) 137 MCG tablet Take 1 tablet (137 mcg total) by mouth daily.   lisinopril  (ZESTRIL ) 40 MG tablet Take 1 tablet (40 mg total) by mouth daily.   metoprolol  succinate (TOPROL -XL) 100 MG 24 hr tablet Take 1 tablet (100 mg total) by mouth daily. Take with or immediately following a meal.   Red Yeast Rice Extract (RED YEAST RICE PO) Take by mouth.   rosuvastatin  (CRESTOR ) 20 MG tablet TAKE 1 TABLET(20 MG) BY MOUTH AT BEDTIME   [DISCONTINUED] diclofenac  (VOLTAREN ) 75 MG EC tablet Take 1 tablet (75 mg total) by mouth 2 (two) times daily as needed.    Allergies: Patient is allergic to hydrochlorothiazide and niacin and related. Family History: Patient family history includes Cancer in his maternal grandmother and mother; Colon cancer in his maternal aunt and mother; Early death in his sister; Heart disease in his sister; Hyperlipidemia in his father; Hypertension in his brother, brother, brother, father, sister, sister, sister, sister, and son; Lung cancer in his maternal aunt; Stroke in his paternal grandfather. Social History:  Patient  reports that he has never smoked. He has never used smokeless tobacco. He reports that he does not drink alcohol and does not use drugs.  Review of Systems: Constitutional: Negative for fever malaise or anorexia Cardiovascular: negative for chest pain Respiratory: negative for SOB or persistent cough Gastrointestinal: negative for abdominal pain  Objective  Vitals: BP (!) 154/95 Comment: Machine  Pulse 60   Temp 98.1 F (36.7 C)   Ht 5' 11 (1.803 m)   SpO2 98%   BMI 33.19 kg/m  General: no acute distress , A&Ox3 Nl gait Bilateral knees are normal appearing.  No  crepitus, warmth or effusions.  Mild medial ttp on left. FROM. Nl strength No laxity, neg mcmurrays.   Commons side effects, risks, benefits, and alternatives for medications and treatment plan prescribed today were discussed, and the patient expressed understanding of the given instructions. Patient is instructed to call or message via MyChart if he/she has any questions or concerns regarding our treatment plan. No barriers to understanding were identified. We discussed Red Flag symptoms and signs in detail. Patient expressed understanding regarding what to do in case of urgent or emergency type symptoms.  Medication list was reconciled, printed and provided to the patient in AVS. Patient instructions and summary information was reviewed with the patient as documented in the AVS. This note was prepared with assistance of Dragon voice recognition software. Occasional wrong-word or sound-a-like substitutions may have occurred due to the inherent limitations of voice  recognition software

## 2024-02-20 IMAGING — DX DG LUMBAR SPINE COMPLETE 4+V
5 series · 5 of 5 positions shown · non-contrast
Comparison: May 30, 2012.

CLINICAL DATA: Lower back pain for 2 months without known injury.

EXAM:
LUMBAR SPINE - COMPLETE 4+ VIEW

[l-spine ap]
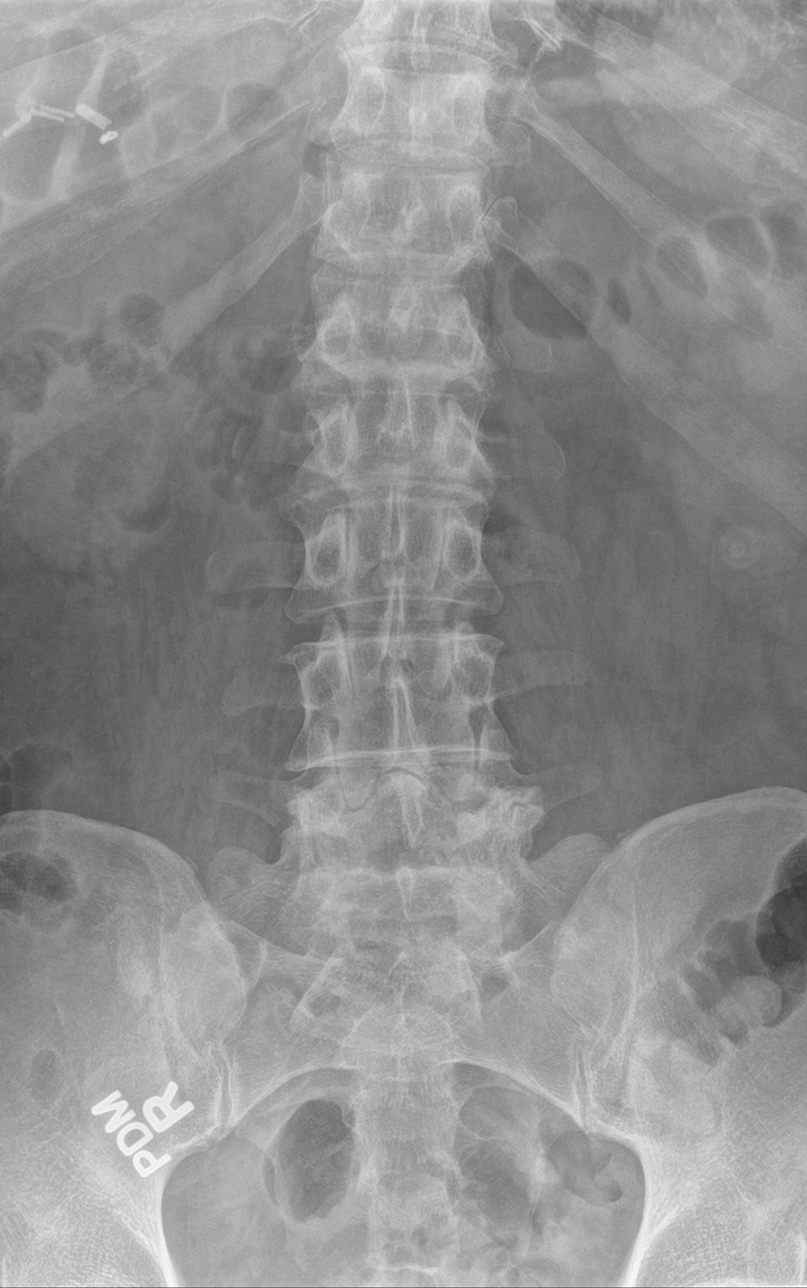

[l-spine obl (1 of 2)]
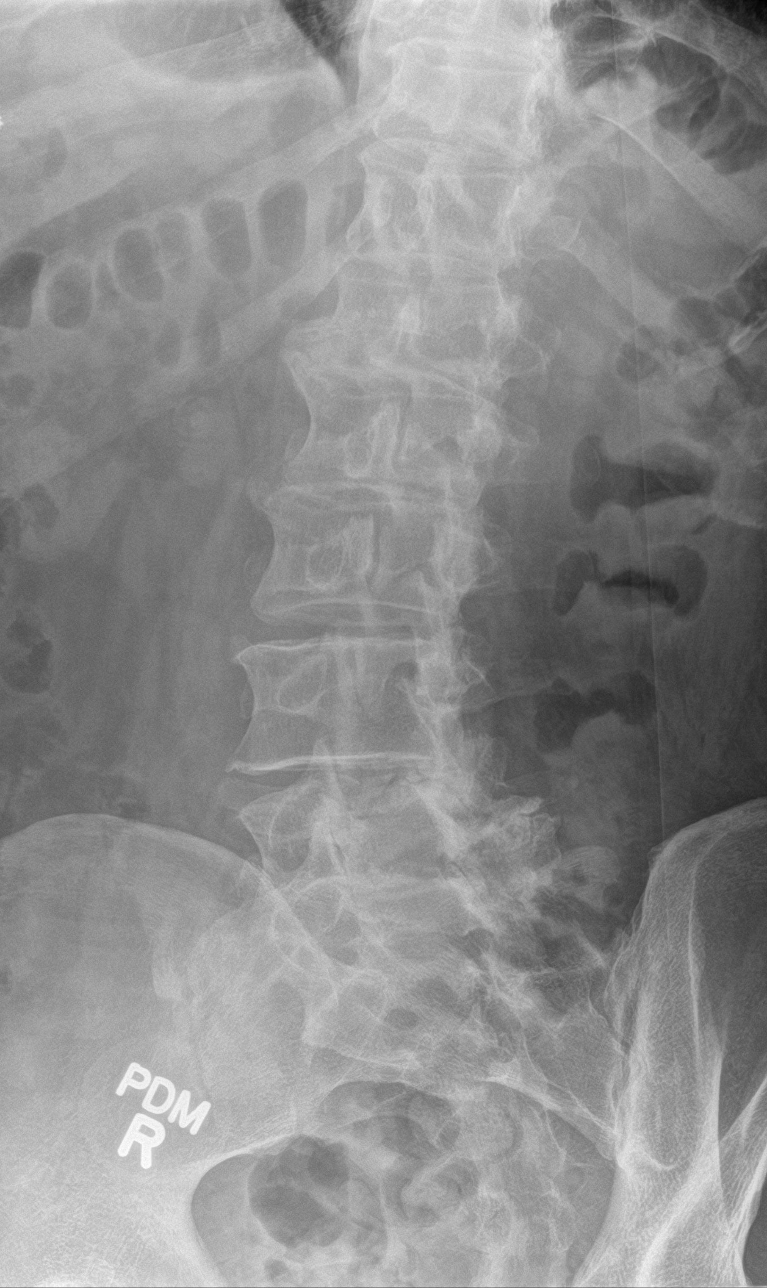

[l-spine obl (2 of 2)]
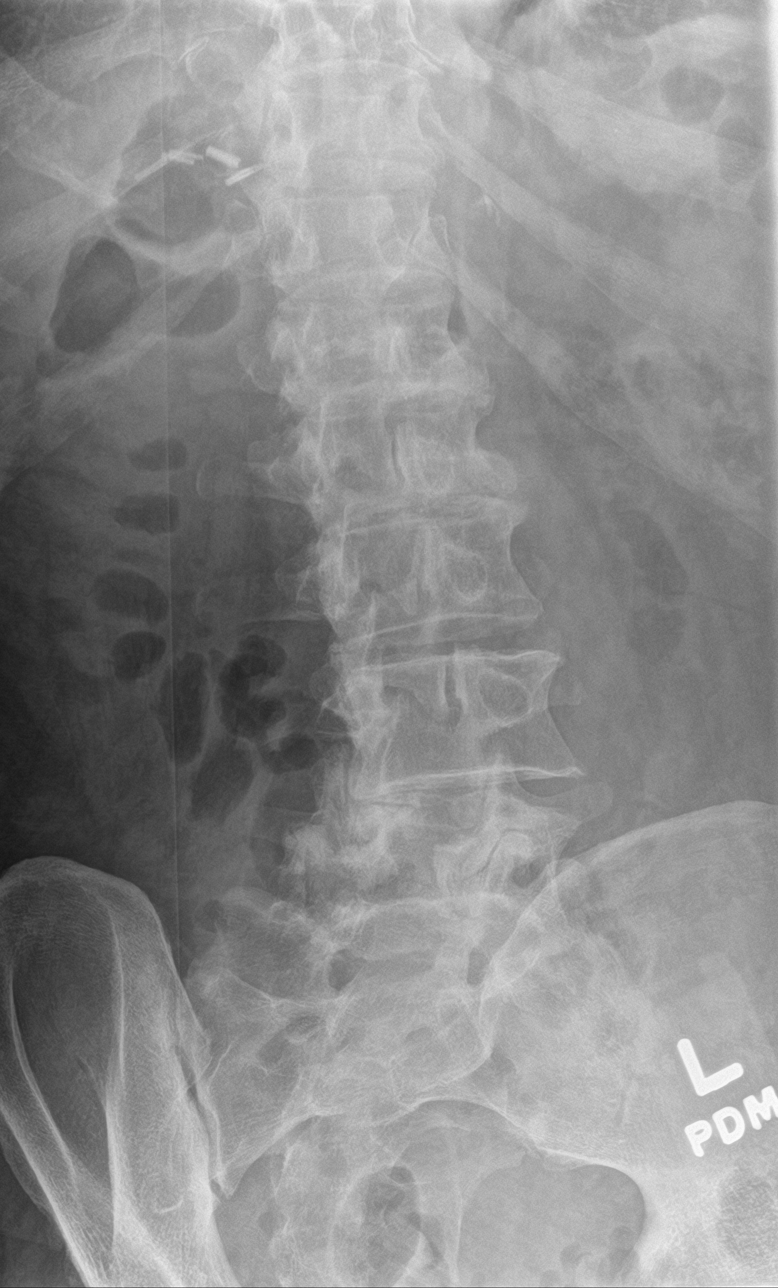

[l-spine lat]
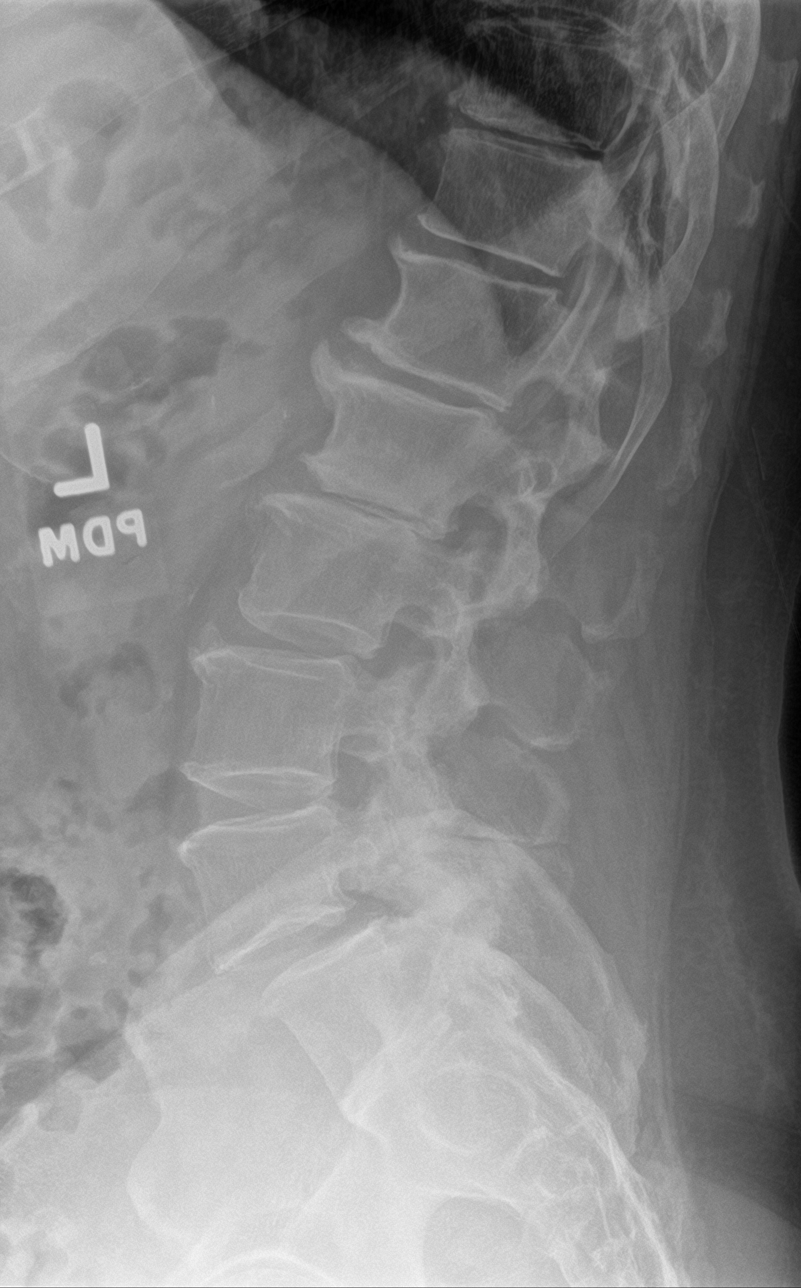

[l-spine spot]
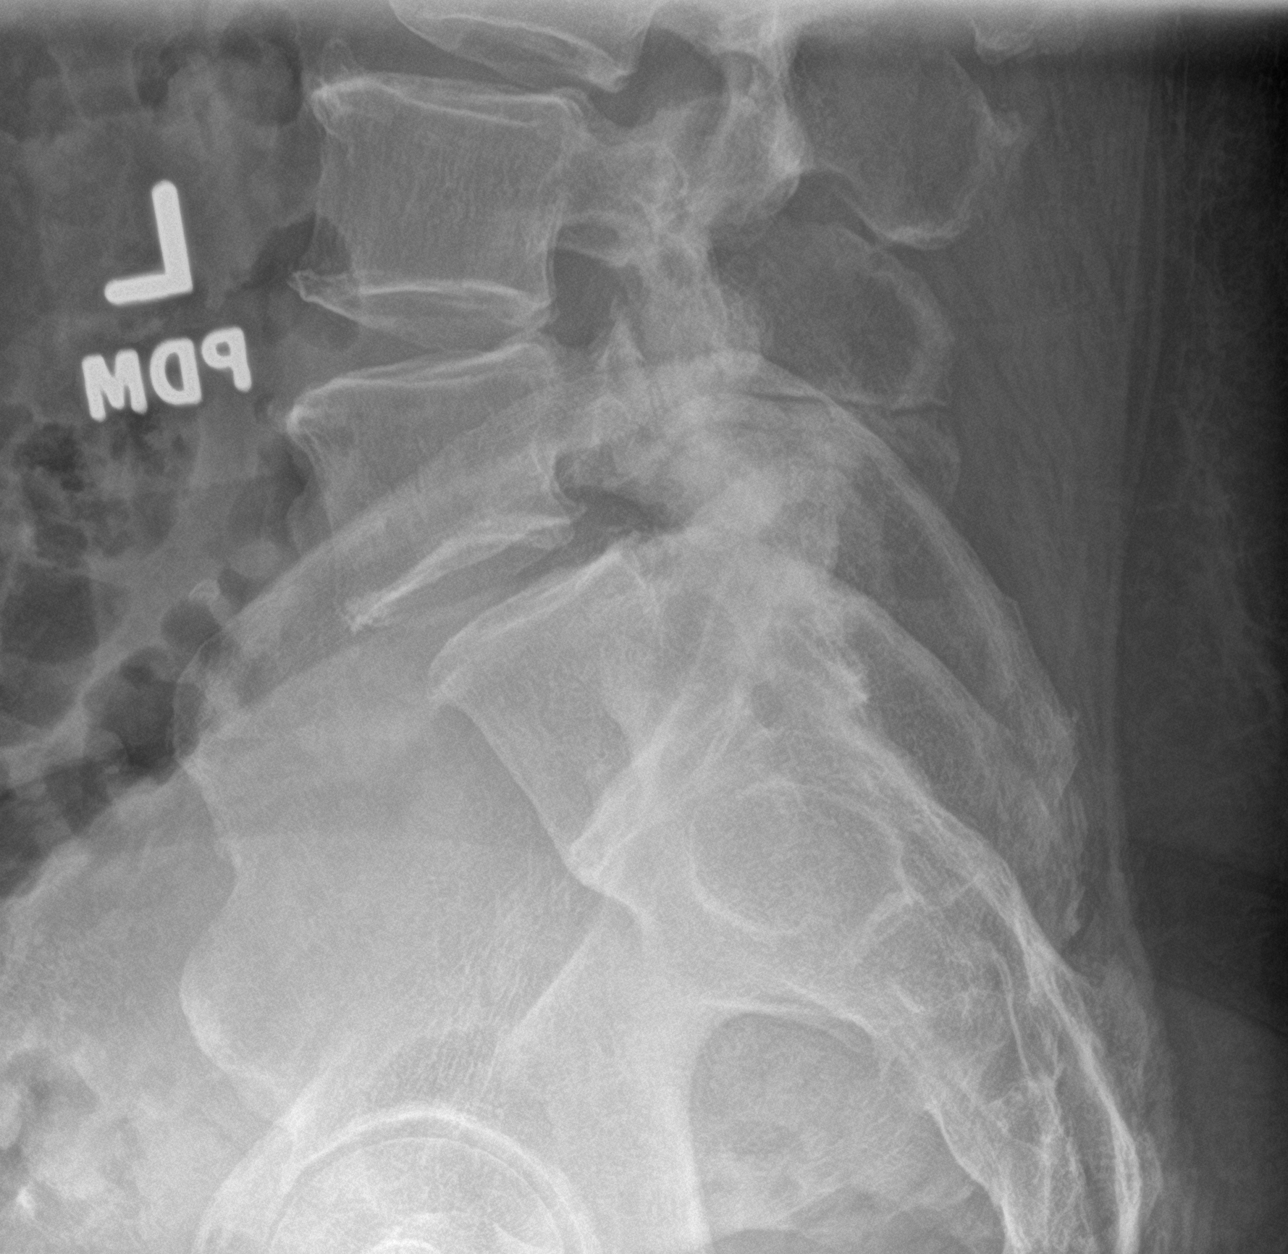

[5 of 5 positions shown; findings below may reference images not displayed]

FINDINGS: Stable grade 1 anterolisthesis of L5-S1 is noted secondary to
posterior facet joint hypertrophy. Mild grade 1 retrolisthesis of
L2-3 and L3-4 is noted secondary to degenerative disease at these
levels. Moderate degenerative disc disease is noted at L1-2. No
fracture is noted.
IMPRESSION: Multilevel degenerative changes as described above. No acute
abnormality is noted.

## 2024-03-08 ENCOUNTER — Ambulatory Visit: Payer: Medicare HMO | Admitting: Family Medicine

## 2024-03-15 ENCOUNTER — Ambulatory Visit: Payer: Medicare HMO | Admitting: Family Medicine

## 2024-03-29 ENCOUNTER — Encounter: Payer: Self-pay | Admitting: Family Medicine

## 2024-03-29 ENCOUNTER — Ambulatory Visit

## 2024-03-29 ENCOUNTER — Ambulatory Visit (INDEPENDENT_AMBULATORY_CARE_PROVIDER_SITE_OTHER): Payer: Medicare HMO | Admitting: Family Medicine

## 2024-03-29 VITALS — BP 120/70 | HR 63 | Temp 99.0°F | Ht 71.0 in | Wt 232.8 lb

## 2024-03-29 DIAGNOSIS — I10 Essential (primary) hypertension: Secondary | ICD-10-CM

## 2024-03-29 DIAGNOSIS — M25562 Pain in left knee: Secondary | ICD-10-CM

## 2024-03-29 DIAGNOSIS — M85862 Other specified disorders of bone density and structure, left lower leg: Secondary | ICD-10-CM | POA: Diagnosis not present

## 2024-03-29 DIAGNOSIS — R7301 Impaired fasting glucose: Secondary | ICD-10-CM

## 2024-03-29 DIAGNOSIS — M1712 Unilateral primary osteoarthritis, left knee: Secondary | ICD-10-CM | POA: Diagnosis not present

## 2024-03-29 NOTE — Patient Instructions (Signed)
 Please return in 6 months for your annual complete physical; please come fasting.    If you have any questions or concerns, please don't hesitate to send me a message via MyChart or call the office at [phone]. Thank you for visiting with us  today! It's our pleasure caring for you.    VISIT SUMMARY: You came in today for your six-month checkup. We discussed your blood pressure, diabetes management, knee pain, back pain, and overall wellness. Your blood pressure readings at home are generally good, and your last A1c was 6.0, indicating good control of your blood sugar levels. We also talked about your knee and back pain and made a plan to address these issues.  YOUR PLAN: -KNEE PAIN, POSSIBLE ARTHRITIS: Your chronic knee pain may be due to arthritis. We will order an x-ray to check for arthritis. Continue using Voltaren  gel twice daily and apply ice as needed. If your symptoms worsen, we may consider a steroid injection or viscous supplementation. Strengthening your quadriceps can also help.  -BACK PAIN DUE TO MUSCLE STRAIN: Your back pain is likely due to muscle strain from physical activity and weak core and back muscles. Start doing core strengthening exercises and use your total gym for back strengthening. You can take Advil or Tylenol  for pain as needed.  -HYPERTENSION: Your blood pressure is well-controlled, though you sometimes have higher readings due to 'white coat syndrome.' Continue with your current blood pressure medication.  -PRE-DIABETES: Your A1c level of 6.0% indicates pre-diabetes, which means your blood sugar levels are higher than normal but not high enough to be classified as diabetes. Keep up with your dietary changes to reduce sugar intake and monitor your A1c periodically.   INSTRUCTIONS: Please schedule a knee x-ray to assess for arthritis. Continue with your current medications and lifestyle changes. Start core strengthening exercises and use your total gym for back  strengthening. If your knee pain worsens, contact us  to discuss further treatment options. We will monitor your A1c periodically to keep track of your blood sugar levels. Your next physical is scheduled for six months from now.                      Contains text generated by Abridge.                                 Contains text generated by Abridge.

## 2024-03-29 NOTE — Progress Notes (Signed)
 Subjective  CC:  Chief Complaint  Patient presents with   Hypertension    HPI: Rodney Pineda is a 73 y.o. male who presents to the office today to address the problems listed above in the chief complaint. Hypertension f/u:  Discussed the use of AI scribe software for clinical note transcription with the patient, who gave verbal consent to proceed.  History of Present Illness Rodney Pineda "Rodney Pineda" is a 73 year old male with hypertension and prediabetes who presents for a six-month checkup.  He monitors his blood pressure at home, with systolic values generally around 120s mmHg, occasionally rising to 140 mmHg when he first sits down, but then decreasing after a few minutes. His diastolic readings are in the 60s. He mentions having 'white coat syndrome'. Tolerates meds. No cp. No sob. Occ edema.   His last A1c in October was 6.0. He feels he is managing his diet well and does not feel like his blood sugars are running high. He is actively trying to cut back on sugars and eat cleaner foods.  He experiences ongoing knee pain, primarily at night, which worsens when he is in bed. A ten-day course of voltaren  previously helped significantly during a trip to [place] . He uses Voltaren  gel, which provides some relief, and notes that he cannot straighten his knee as much as the other one. His knees feel weak, especially during activities like hiking. No locking or giveway.   He describes back pain that occurs when he is working hard, such as splitting wood, which sometimes makes it difficult to breathe deeply. 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  He is currently retired for the second time and has a total gym at home, which he has not been using regularly.   Assessment  1. White coat syndrome with diagnosis of hypertension   2. Acute pain of left knee   3. IFG (impaired fasting  glucose)      Plan  Assessment and Plan Assessment and Plan Assessment & Plan Knee pain, possible arthritis Chronic knee pain, possibly arthritis, with effective past medication treatment and partial relief from Voltaren  gel. - Order knee x-ray to assess for arthritis. - Use Voltaren  gel twice daily. - Apply ice as needed. - Consider steroid injection or viscous supplementation if symptoms worsen. - Strengthen quadriceps.  Back pain due to muscle strain Back pain from muscle strain, worsened by physical activity, likely due to weak core and back muscles. - Initiate core strengthening exercises. - Use total gym for back strengthening. - Consider Advil or Tylenol  for pain as needed.  Hypertension Blood pressure well-controlled with occasional higher readings due to white coat syndrome. - Maintain current antihypertensive regimen.  Pre-diabetes A1c at 6.0% indicates pre-diabetes, with reported dietary improvements. - Continue dietary modifications to reduce sugar intake. - Monitor A1c periodically.     Education regarding management of these chronic disease states was given. Management strategies discussed on successive visits include dietary and exercise recommendations, goals of achieving and maintaining IBW, and lifestyle modifications aiming for adequate sleep and minimizing stressors.  Follow up: F/u 6 mo for cpe  Orders Placed This Encounter  Procedures   DG Knee Complete 4 Views Left   No orders of the defined types were placed in this encounter.     BP Readings from Last 3 Encounters:  03/29/24 120/70  11/28/23 (!) 154/95  09/12/23 119/70  Wt Readings from Last 3 Encounters:  03/29/24 232 lb 12.8 oz (105.6 kg)  09/15/23 238 lb (108 kg)  09/12/23 238 lb 3.2 oz (108 kg)    Lab Results  Component Value Date   CHOL 148 09/12/2023   CHOL 145 09/02/2022   CHOL 156 08/30/2021   Lab Results  Component Value Date   HDL 40.90 09/12/2023   HDL 48.40  09/02/2022   HDL 43.60 08/30/2021   Lab Results  Component Value Date   LDLCALC 63 09/12/2023   LDLCALC 72 09/02/2022   LDLCALC 75 08/30/2021   Lab Results  Component Value Date   TRIG 221.0 (H) 09/12/2023   TRIG 123.0 09/02/2022   TRIG 185.0 (H) 08/30/2021   Lab Results  Component Value Date   CHOLHDL 4 09/12/2023   CHOLHDL 3 09/02/2022   CHOLHDL 4 08/30/2021   Lab Results  Component Value Date   LDLDIRECT 84.0 03/16/2018   Lab Results  Component Value Date   CREATININE 0.95 09/12/2023   BUN 17 09/12/2023   NA 139 09/12/2023   K 4.3 09/12/2023   CL 105 09/12/2023   CO2 28 09/12/2023    The 10-year ASCVD risk score (Arnett DK, et al., 2019) is: 19%   Values used to calculate the score:     Age: 82 years     Sex: Male     Is Non-Hispanic African American: No     Diabetic: No     Tobacco smoker: No     Systolic Blood Pressure: 120 mmHg     Is BP treated: Yes     HDL Cholesterol: 40.9 mg/dL     Total Cholesterol: 148 mg/dL  I reviewed the patients updated PMH, FH, and SocHx.    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 He has never used smokeless tobacco. He reports that he does not drink alcohol and does not use drugs.  No outpatient medications have been marked as taking for the 03/29/24  encounter (Office Visit) with Luevenia Saha, MD.    Review of Systems: Cardiovascular: negative for chest pain, palpitations, leg swelling, orthopnea Respiratory: negative for SOB, wheezing or persistent cough Gastrointestinal: negative for abdominal pain Genitourinary: negative for dysuria or gross hematuria  Objective  Vitals: BP 120/70 Comment: by consistent home readings  Pulse 63   Temp 99 F (37.2 C)   Ht 5\' 11"  (1.803 m)   Wt 232 lb 12.8 oz (105.6 kg)   SpO2 97%   BMI 32.47 kg/m  General: no acute distress  Psych:  Alert and oriented, normal mood and affect HEENT:  Normocephalic, atraumatic, supple neck  Cardiovascular:  RRR without murmur. no edema Respiratory:  Good breath sounds bilaterally, CTAB with normal respiratory effort Left knee: mild swelling and warmth, no joint line ttp. FROM. No crepitus. No laxity Commons side effects, risks, benefits, and alternatives  for medications and treatment plan prescribed today were discussed, and the patient expressed understanding of the given instructions. Patient is instructed to call or message via MyChart if he/she has any questions or concerns regarding our treatment plan. No barriers to understanding were identified. We discussed Red Flag symptoms and signs in detail. Patient expressed understanding regarding what to do in case of urgent or emergency type symptoms.  Medication list was reconciled, printed and provided to the patient in AVS. Patient instructions and summary information was reviewed with the patient as documented in the AVS. This note was prepared with assistance of Dragon voice recognition software. Occasional wrong-word or sound-a-like substitutions may have occurred due to the inherent limitation

## 2024-04-13 ENCOUNTER — Ambulatory Visit: Payer: Self-pay | Admitting: Family Medicine

## 2024-04-13 DIAGNOSIS — M1712 Unilateral primary osteoarthritis, left knee: Secondary | ICD-10-CM | POA: Insufficient documentation

## 2024-04-13 NOTE — Progress Notes (Signed)
 See mychart note Dear [name], Your xray does confirm some mild arthritis in your knee joint. Please follow up with me if your symptoms are worsening in spite of the treatments we discussed at your visit.  Sincerely, Dr. Jonelle Neri

## 2024-09-13 ENCOUNTER — Ambulatory Visit: Payer: Medicare HMO | Admitting: Family Medicine

## 2024-09-13 ENCOUNTER — Encounter: Payer: Self-pay | Admitting: Family Medicine

## 2024-09-13 VITALS — BP 124/72 | HR 52 | Temp 97.7°F | Ht 71.0 in | Wt 234.4 lb

## 2024-09-13 DIAGNOSIS — Z0000 Encounter for general adult medical examination without abnormal findings: Secondary | ICD-10-CM

## 2024-09-13 DIAGNOSIS — I10 Essential (primary) hypertension: Secondary | ICD-10-CM

## 2024-09-13 DIAGNOSIS — R7301 Impaired fasting glucose: Secondary | ICD-10-CM

## 2024-09-13 DIAGNOSIS — Z800 Family history of malignant neoplasm of digestive organs: Secondary | ICD-10-CM | POA: Diagnosis not present

## 2024-09-13 DIAGNOSIS — E039 Hypothyroidism, unspecified: Secondary | ICD-10-CM | POA: Diagnosis not present

## 2024-09-13 DIAGNOSIS — E782 Mixed hyperlipidemia: Secondary | ICD-10-CM

## 2024-09-13 DIAGNOSIS — R609 Edema, unspecified: Secondary | ICD-10-CM | POA: Diagnosis not present

## 2024-09-13 DIAGNOSIS — Z0001 Encounter for general adult medical examination with abnormal findings: Secondary | ICD-10-CM

## 2024-09-13 LAB — CBC WITH DIFFERENTIAL/PLATELET
Basophils Absolute: 0.1 K/uL (ref 0.0–0.1)
Basophils Relative: 1 % (ref 0.0–3.0)
Eosinophils Absolute: 0.2 K/uL (ref 0.0–0.7)
Eosinophils Relative: 3.6 % (ref 0.0–5.0)
HCT: 43.9 % (ref 39.0–52.0)
Hemoglobin: 14.6 g/dL (ref 13.0–17.0)
Lymphocytes Relative: 33.1 % (ref 12.0–46.0)
Lymphs Abs: 1.9 K/uL (ref 0.7–4.0)
MCHC: 33.4 g/dL (ref 30.0–36.0)
MCV: 87.4 fl (ref 78.0–100.0)
Monocytes Absolute: 0.4 K/uL (ref 0.1–1.0)
Monocytes Relative: 6.4 % (ref 3.0–12.0)
Neutro Abs: 3.2 K/uL (ref 1.4–7.7)
Neutrophils Relative %: 55.9 % (ref 43.0–77.0)
Platelets: 213 K/uL (ref 150.0–400.0)
RBC: 5.02 Mil/uL (ref 4.22–5.81)
RDW: 13 % (ref 11.5–15.5)
WBC: 5.7 K/uL (ref 4.0–10.5)

## 2024-09-13 LAB — MICROALBUMIN / CREATININE URINE RATIO
Creatinine,U: 37.5 mg/dL
Microalb Creat Ratio: UNDETERMINED mg/g (ref 0.0–30.0)
Microalb, Ur: <0.7 mg/dL

## 2024-09-13 LAB — LIPID PANEL
Cholesterol: 259 mg/dL — ABNORMAL HIGH (ref 0–200)
HDL: 40.8 mg/dL (ref >39.00)
LDL Cholesterol: 183 mg/dL — ABNORMAL HIGH (ref 0–99)
NonHDL: 218.12
Total CHOL/HDL Ratio: 6
Triglycerides: 178 mg/dL — ABNORMAL HIGH (ref 0.0–149.0)
VLDL: 35.6 mg/dL (ref 0.0–40.0)

## 2024-09-13 LAB — COMPREHENSIVE METABOLIC PANEL WITH GFR
ALT: 13 U/L (ref 0–53)
AST: 16 U/L (ref 0–37)
Albumin: 4.3 g/dL (ref 3.5–5.2)
Alkaline Phosphatase: 70 U/L (ref 39–117)
BUN: 16 mg/dL (ref 6–23)
CO2: 27 meq/L (ref 19–32)
Calcium: 9.4 mg/dL (ref 8.4–10.5)
Chloride: 104 meq/L (ref 96–112)
Creatinine, Ser: 0.91 mg/dL (ref 0.40–1.50)
GFR: 84.7 mL/min (ref >60.00)
Glucose, Bld: 114 mg/dL — ABNORMAL HIGH (ref 70–99)
Potassium: 4.4 meq/L (ref 3.5–5.1)
Sodium: 138 meq/L (ref 135–145)
Total Bilirubin: 0.7 mg/dL (ref 0.2–1.2)
Total Protein: 6.7 g/dL (ref 6.0–8.3)

## 2024-09-13 LAB — TSH: TSH: 5.43 u[IU]/mL (ref 0.35–5.50)

## 2024-09-13 LAB — HEMOGLOBIN A1C: Hgb A1c MFr Bld: 6 % (ref 4.6–6.5)

## 2024-09-13 MED ORDER — LISINOPRIL 40 MG PO TABS: 40.0000 mg | ORAL_TABLET | Freq: Every day | ORAL | 3 refills | Status: AC

## 2024-09-13 MED ORDER — LEVOTHYROXINE SODIUM 137 MCG PO TABS: 137.0000 ug | ORAL_TABLET | Freq: Every day | ORAL | 3 refills | Status: AC

## 2024-09-13 MED ORDER — METOPROLOL SUCCINATE ER 100 MG PO TB24: 100.0000 mg | ORAL_TABLET | Freq: Every day | ORAL | 3 refills | Status: AC

## 2024-09-13 MED ORDER — AMLODIPINE BESYLATE 5 MG PO TABS: 5.0000 mg | ORAL_TABLET | Freq: Every day | ORAL | 3 refills | Status: AC

## 2024-09-13 NOTE — Patient Instructions (Signed)
 Please return in 12 months for your annual complete physical; please come fasting.   I will release your lab results to you on your MyChart account with further instructions. You may see the results before I do, but when I review them I will send you a message with my report or have my assistant call you if things need to be discussed. Please reply to my message with any questions. Thank you!   If you have any questions or concerns, please don't hesitate to send me a message via MyChart or call the office at [phone]. Thank you for visiting with us  today! It's our pleasure caring for you.    VISIT SUMMARY: During your routine follow-up visit, we discussed your blood pressure, dietary modifications, thyroid -related symptoms, and recent trauma. Your blood pressure readings at home initially showed elevated levels but normalized with rest. You have been attentive to your diet, specifically reducing sugar intake, and have not experienced any symptoms of hyperglycemia. Your thyroid  function appears stable, and you have not reported any new symptoms. We also reviewed your recent fall from a ladder, which did not result in any significant injury.  YOUR PLAN: -ADULT WELLNESS VISIT: This visit was a routine check-up to monitor your overall health. We will perform blood work to check your thyroid  and glucose levels, and I encourage you to get a flu vaccination.  -ESSENTIAL HYPERTENSION: Hypertension is high blood pressure. Your home readings initially showed elevated levels but normalized with rest. Your current medication regimen is effective, and we will continue with it. Your blood pressure medications are due for a refill in December.  -IMPAIRED FASTING GLUCOSE: Impaired fasting glucose is a condition where blood sugar levels are higher than normal after fasting. You have been attentive to your diet and have not experienced any symptoms of hyperglycemia. We will perform blood work to check your glucose  levels.  -HYPOTHYROIDISM: Hypothyroidism is a condition where the thyroid  gland does not produce enough thyroid  hormone. Your thyroid  function is well-managed with your current medication, and you have not reported any new symptoms. We will perform blood work to check your thyroid  levels.  INSTRUCTIONS: Please schedule your blood work to check thyroid  and glucose levels. Additionally, I encourage you to get a flu vaccination. Your blood pressure medications are due for a refill in December.                      Contains text generated by Abridge.                                 Contains text generated by Abridge.

## 2024-09-13 NOTE — Progress Notes (Signed)
 Subjective  Chief Complaint  Patient presents with   Annual Exam    Pt here for Annual Exam and is currently fasting    Hyperlipidemia   Hypothyroidism    HPI: Rodney Pineda is a 73 y.o. male who presents to [facility] [place] today for a Male Wellness Visit. He also has the concerns and/or needs as listed above in the chief complaint. These will be addressed in addition to the Health Maintenance Visit.   Wellness Visit: annual visit with health maintenance review and exam   HM: screens all current. Eating well. Retired in march. Happy. Has cut back on sweetened beverages. Imms: will get flu shot late October per wife's preference.   Body mass index is 32.69 kg/m. Wt Readings from Last 3 Encounters:  09/13/24 234 lb 6.4 oz (106.3 kg)  03/29/24 232 lb 12.8 oz (105.6 kg)  09/15/23 238 lb (108 kg)   Chronic disease management visit and/or acute problem visit: Discussed the use of AI scribe software for clinical note transcription with the patient, who gave verbal consent to proceed.  History of Present Illness Rodney Pineda is a 73 year old male with hypertension who presents for a routine follow-up visit.  HTN f/u: - Home blood pressure readings initially around 140/90 mmHg, decreasing to 120/80 mmHg after resting; has always had strong white coat component - Experiences nervousness at times, but able to normalize blood pressure with rest - No chest pain, shortness of breath, or palpitations - on amlodipine , metoprolol  and lisinopril   HLD on crestor  for recheck.  Dietary modifications and glycemic symptoms, h/o IFG: - Attentive to diet, specifically reducing sugar intake - Reduced consumption of sweet tea; now chooses low-calorie, no-sugar beverages - No symptoms of hyperglycemia, including blurred vision, increased thirst, or increased urination  Hypothyroidism: on levtx - No significant changes in energy levels, sleep patterns, or skin and  hair condition - Occasional nocturnal awakenings due to thinking, but this is a longstanding symptom  Recent trauma - Sustained a fall from a six-foot ladder four weeks ago - Experienced some discomfort after the fall but did not seek medical attention - Able to get up and continue activities following the incident    Assessment  1. Encounter for well adult exam with abnormal findings   2. Acquired hypothyroidism   3. Dependent edema   4. IFG (impaired fasting glucose)   5. Mixed hyperlipidemia   6. Family history of colon cancer   7. White coat syndrome with diagnosis of hypertension      Plan  Male Wellness Visit: Age appropriate Health Maintenance and Prevention measures were discussed with patient. Included topics are cancer screening recommendations, ways to keep healthy (see AVS) including dietary and exercise recommendations, regular eye and dental care, use of seat belts, and avoidance of moderate alcohol use and tobacco use.  BMI: discussed patient's BMI and encouraged positive lifestyle modifications to help get to or maintain a target BMI. HM needs and immunizations were addressed and ordered. See below for orders. See HM and immunization section for updates. Routine labs and screening tests ordered including cmp, cbc and lipids where appropriate. Discussed recommendations regarding Vit D and calcium  supplementation (see AVS)  Chronic disease f/u and/or acute problem visit: (deemed necessary to be done in addition to the wellness visit): Assessment and Plan Assessment & Plan Adult Wellness Visit Routine adult wellness visit with no significant changes in health status. Retirement adjustment ongoing. No new symptoms reported. Colonoscopy is  up to date. - Perform blood work to check thyroid  and glucose levels - Encourage flu vaccination  Essential hypertension Blood pressure readings at home show initial elevated readings (140-142 mmHg) that normalize with rest (120-130  mmHg). Current medication regimen is effective in controlling blood pressure. - Continue current blood pressure medications - Refill blood pressure medications due in December  Impaired fasting glucose No symptoms of hyperglycemia reported. Diet includes reduced sugar intake with increased consumption of low-calorie drinks. Monitoring of glucose levels is necessary. - Perform blood work to check glucose levels  Hypothyroidism Thyroid  function is well-managed with current medication. No new symptoms reported. Energy levels and sleep patterns are consistent with previous reports. - Perform blood work to check thyroid  levels  HLD On crestor  fasting for recheck.    Follow up: 12 mo for cpe and recheck Commons side effects, risks, benefits, and alternatives for medications and treatment plan prescribed today were discussed, and the patient expressed understanding of the given instructions. Patient is instructed to call or message via MyChart if he/she has any questions or concerns regarding our treatment plan. No barriers to understanding were identified. We discussed Red Flag symptoms and signs in detail. Patient expressed understanding regarding what to do in case of urgent or emergency type symptoms.  Medication list was reconciled, printed and provided to the patient in AVS. Patient instructions and summary information was reviewed with the patient as documented in the AVS. This note was prepared with assistance of Dragon voice recognition software. Occasional wrong-word or sound-a-like substitutions may have occurred due to the inherent limitations of voice recognition software  Orders Placed This Encounter  Procedures   CBC with Differential/Platelet   Comprehensive metabolic panel with GFR   Lipid panel   Hemoglobin A1c   TSH   Microalbumin / creatinine urine ratio   Meds ordered this encounter  Medications   amLODipine  (NORVASC ) 5 MG tablet    Sig: Take 1 tablet (5 mg total) by  mouth daily.    Dispense:  90 tablet    Refill:  3   levothyroxine  (SYNTHROID ) 137 MCG tablet    Sig: Take 1 tablet (137 mcg total) by mouth daily.    Dispense:  90 tablet    Refill:  3   lisinopril  (ZESTRIL ) 40 MG tablet    Sig: Take 1 tablet (40 mg total) by mouth daily.    Dispense:  90 tablet    Refill:  3   metoprolol  succinate (TOPROL -XL) 100 MG 24 hr tablet    Sig: Take 1 tablet (100 mg total) by mouth daily. Take with or immediately following a meal.    Dispense:  90 tablet    Refill:  3     Patient Active Problem List   Diagnosis Date Noted   Dependent edema 02/28/2021   Family history of colon cancer 01/31/2014   IFG (impaired fasting glucose) 05/07/2011   Acquired hypothyroidism 01/28/2011   White coat syndrome with diagnosis of hypertension 01/28/2011   Mixed hyperlipidemia 01/28/2011   Primary osteoarthritis of left knee 04/13/2024   Spondylosis of lumbar region without myelopathy or radiculopathy 11/28/2023   Basal cell carcinoma (BCC) of skin of left upper extremity including shoulder 03/12/2017   Colon polyps 05/01/2015   Osteoarthritis, 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   BMI 30.0-30.9,adult 04/17/2010   Allergic rhinitis 07/11/2010   Health Maintenance  Topic Date Due   Medicare Annual Wellness (AWV)  09/14/2024   COVID-19 Vaccine (1) 09/29/2024 (Originally 01/14/1956)   Influenza Vaccine  02/22/2025 (  Originally 06/25/2024)   Colonoscopy  07/03/2028   DTaP/Tdap/Td (3 - Td or Tdap) 05/24/2029   Pneumococcal Vaccine: 50+ Years  Completed   Hepatitis C Screening  Completed   Zoster Vaccines- Shingrix   Completed   Meningococcal B Vaccine  Aged Out   Immunization History  Administered Date(s) Administered   Fluad Quad(high Dose 65+) 08/29/2021   INFLUENZA, HIGH DOSE SEASONAL PF 09/14/2018   Influenza Whole 09/09/2019   Pneumococcal Conjugate-13 03/16/2018   Pneumococcal Polysaccharide-23 05/25/2019   Tdap 08/14/2009, 05/25/2019   Zoster Recombinant(Shingrix ) 08/21/2021,  02/07/2022   Zoster, Live 01/31/2014   We updated and reviewed the patient's past history in detail and it is documented below. Allergies: Patient is allergic to hydrochlorothiazide and niacin and related. Past Medical History  has a past medical history of Allergy, Arthritis (as dx'd by MD), Disease of thyroid  gland, Diverticulitis, Family history of colon cancer, Hyperlipidemia, Hypertension, and Snake envenomation. Past Surgical History Patient  has a past surgical history that includes Appendectomy (1995) and Cholecystectomy (2010). Social History Patient  reports that he has never smoked. He has never used smokeless tobacco. He reports that he does not drink alcohol and does not use drugs. Family History family history includes Cancer in his maternal grandmother and mother; Colon cancer in his maternal aunt and mother; Early death in his sister; Heart disease in his sister; Hyperlipidemia in his father; Hypertension in his brother, brother, brother, father, sister, sister, sister, sister, and son; Lung cancer in his maternal aunt; Stroke in his paternal grandfather. Review of Systems: Constitutional: negative for fever or malaise Ophthalmic: negative for photophobia, double vision or loss of vision Cardiovascular: negative for chest pain, dyspnea on exertion, or new LE swelling Respiratory: negative for SOB or persistent cough Gastrointestinal: negative for abdominal pain, change in bowel habits or melena Genitourinary: negative for dysuria or gross hematuria Musculoskeletal: negative for new gait disturbance or muscular weakness Integumentary: negative for new or persistent rashes Neurological: negative for TIA or stroke symptoms Psychiatric: negative for SI or delusions Allergic/Immunologic: negative for hives  Patient Care Team    Relationship Specialty Notifications Start End  Jodie Lavern CROME, MD PCP - General Family Medicine  03/01/13   Kristie Lamprey, MD Consulting Physician  Gastroenterology  03/16/18    Objective  Vitals: BP 124/72 Comment: by consistent home readings  Pulse (!) 52   Temp 97.7 F (36.5 C)   Ht 5' 11 (1.803 m)   Wt 234 lb 6.4 oz (106.3 kg)   SpO2 99%   BMI 32.69 kg/m  General:  Well developed, well nourished, no acute distress  Psych:  Alert and orientedx3,normal mood and affect HEENT:  Normocephalic, atraumatic, non-icteric sclera,  oropharynx is clear without mass or exudate, supple neck without adenopathy, or thyromegaly Cardiovascular:  Normal S1, S2, RRR without gallop, rub or murmur, tr edema bilaterally Respiratory:  Good breath sounds bilaterally, CTAB with normal respiratory effort Gastrointestinal: normal bowel sounds, soft, non-tender, no noted masses. No HSM MSK: Joints are without erythema or swelling. Left shin with healing wound/abrasion Skin:  Warm, no rashes Neurologic:    Mental status is normal.  Gross motor and sensory exams are normal. Stable gait. No tremor

## 2024-09-18 ENCOUNTER — Ambulatory Visit: Payer: Self-pay | Admitting: Family Medicine

## 2024-09-18 NOTE — Progress Notes (Signed)
 See mychart note Dear Mr. Nardozzi, Your cholesterol is extremely elevated again. Have you stopped taking your crestor ? I recommend restarting it to lower your levels to normal as this will decrease your risk of heart disease and stroke. Your prediabetes is stable. Your other labs are stable as well.  Sincerely, Dr. Jodie

## 2024-10-05 ENCOUNTER — Ambulatory Visit (INDEPENDENT_AMBULATORY_CARE_PROVIDER_SITE_OTHER)

## 2024-10-05 VITALS — BP 142/83 | Ht 71.5 in | Wt 234.0 lb

## 2024-10-05 DIAGNOSIS — Z0000 Encounter for general adult medical examination without abnormal findings: Secondary | ICD-10-CM | POA: Diagnosis not present

## 2024-10-05 NOTE — Progress Notes (Signed)
 Subjective:   Rodney Pineda is a 73 y.o. male who presents for a Medicare Annual Wellness Visit.  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   Past Surgical History:  Procedure Laterality Date   APPENDECTOMY  1995   CHOLECYSTECTOMY  2010   Family History  Problem Relation Age of Onset   Colon cancer Mother    Cancer Mother    Hypertension Father    Hyperlipidemia Father    Hypertension Sister    Early death Sister    Heart disease Sister    Hypertension Brother    Cancer Maternal Grandmother        unknown type   Colon cancer Maternal Aunt    Lung cancer Maternal Aunt    Hypertension Son    Stroke Paternal Grandfather    Hypertension Sister    Hypertension Sister    Hypertension Sister    Hypertension Brother    Hypertension Brother    Social History   Occupational History   Not on file  Tobacco Use   Smoking status: Never   Smokeless tobacco: Never  Vaping Use   Vaping status: Never Used  Substance and Sexual Activity   Alcohol use: No   Drug use: Never   Sexual activity: Yes   Tobacco Counseling Counseling given: Not Answered  SDOH Screenings   Food Insecurity: No Food Insecurity (10/05/2024)  Housing: Unknown (10/05/2024)  Transportation Needs: No Transportation Needs (10/05/2024)  Utilities: Not At Risk (10/05/2024)  Alcohol Screen: Low Risk  (09/06/2022)  Depression (PHQ2-9): Low Risk  (10/05/2024)  Financial Resource Strain: Low Risk  (09/15/2023)  Physical Activity: Inactive (10/05/2024)  Social Connections: Moderately Integrated (10/05/2024)  Stress: No Stress Concern Present (10/05/2024)  Tobacco Use: Low Risk   (10/05/2024)  Health Literacy: Adequate Health Literacy (10/05/2024)   Depression Screen    10/05/2024    1:12 PM 09/13/2024   10:14 AM 03/29/2024    8:40 AM 09/15/2023    1:07 PM 09/12/2023    8:53 AM 03/17/2023    9:52 AM 09/06/2022   11:12 AM  PHQ 2/9 Scores  PHQ - 2 Score 0 0 0 0 0 0 0     Goals Addressed               This Visit's Progress     lose weight (pt-stated)        Lose weight and stay active        Visit info / Clinical Intake: Medicare Wellness Visit Type:: Subsequent Annual Wellness Visit Persons participating in visit:: patient Medicare Wellness Visit Mode:: Telephone If telephone:: video declined Because this visit was a virtual/telehealth visit:: pt reported vitals If Telephone or Video please confirm:: I connected with the patient using audio enabled telemedicine application and verified that I am speaking with the correct person using two identifiers Patient Location:: home Provider Location:: office Information given by:: patient Interpreter Needed?: No Pre-visit prep was completed: yes AWV questionnaire completed by patient prior to visit?: no Living arrangements:: lives with spouse/significant other Patient's Overall Health Status Rating: very good Typical amount of pain:  none Does pain affect daily life?: no Are you currently prescribed opioids?: no  Dietary Habits and Nutritional Risks How many meals a day?: 3 Eats fruit and vegetables daily?: yes Most meals are obtained by: preparing own meals; eating out (out ob fridays) Diabetic:: no  Functional Status Activities of Daily Living (to include ambulation/medication): Independent Ambulation: Independent with device- listed below Home Assistive Devices/Equipment: Eyeglasses Medication Administration: Independent Home Management: Independent Manage your own finances?: yes Primary transportation is: driving Concerns about vision?: no *vision screening is required for WTM* Concerns  about hearing?: no  Fall Screening Falls in the past year?: 0 Number of falls in past year: 0 Was there an injury with Fall?: 0 Fall Risk Category Calculator: 0 Patient Fall Risk Level: Low Fall Risk  Fall Risk Patient at Risk for Falls Due to: No Fall Risks Fall risk Follow up: Falls prevention discussed  Home and Transportation Safety: All rugs have non-skid backing?: yes All stairs or steps have railings?: yes Grab bars in the bathtub or shower?: yes Have non-skid surface in bathtub or shower?: yes Good home lighting?: yes Regular seat belt use?: yes Hospital stays in the last year:: no  Cognitive Assessment Difficulty concentrating, remembering, or making decisions? : no Will 6CIT or Mini Cog be Completed: no 6CIT or Mini Cog Declined: patient alert, oriented, able to answer questions appropriately and recall recent events  Advance Directives (For Healthcare) Does Patient Have a Medical Advance Directive?: Yes Type of Advance Directive: Healthcare Power of Attorney  Reviewed/Updated  Reviewed/Updated: Reviewed All (Medical, Surgical, Family, Medications, Allergies, Care Teams, Patient Goals)        Objective:    Today's Vitals   10/05/24 1303  BP: (!) 157/89  Weight: 234 lb (106.1 kg)  Height: 5' 11.5 (1.816 m)   Body mass index is 32.18 kg/m.  Current Medications (verified) Outpatient Encounter Medications as of 10/05/2024  Medication Sig   amLODipine  (NORVASC ) 5 MG tablet Take 1 tablet (5 mg total) by mouth daily.   B Complex Vitamins (B COMPLEX-B12) TABS    Cholecalciferol (D3 2000) 50 MCG (2000 UT) CAPS    Coenzyme Q10 300 MG CAPS    diclofenac  (VOLTAREN ) 75 MG EC tablet Take 1 tablet (75 mg total) by mouth 2 (two) times daily as needed.   diclofenac  sodium (VOLTAREN ) 1 % GEL Apply 2 g topically 4 (four) times daily as needed.   fish oil-omega-3 fatty acids 1000 MG capsule Take 1 g by mouth 2 (two) times daily.   Flaxseed, Linseed, (FLAX SEEDS PO)  Take by mouth. With omega 3   furosemide  (LASIX ) 20 MG tablet Take 1 tablet (20 mg total) by mouth daily as needed.   GARLIC PO Take 1 tablet by mouth daily.   levothyroxine  (SYNTHROID ) 137 MCG tablet Take 1 tablet (137 mcg total) by mouth daily.   lisinopril  (ZESTRIL ) 40 MG tablet Take 1 tablet (40 mg total) by mouth daily.   metoprolol  succinate (TOPROL -XL) 100 MG 24 hr tablet Take 1 tablet (100 mg total) by mouth daily. Take with or immediately following a meal.   Red Yeast Rice Extract (RED YEAST RICE PO) Take by mouth.   rosuvastatin  (CRESTOR ) 20 MG tablet TAKE 1 TABLET(20 MG) BY MOUTH AT BEDTIME   No facility-administered encounter medications on file as of 10/05/2024.   Hearing/Vision screen Hearing Screening - Comments:: Pt denies any hearing issues  Vision Screening - Comments:: Wears rx glasses - up to date with routine eye exams with Dr  Tanner  Immunizations and Health Maintenance Health Maintenance  Topic Date Due   COVID-19 Vaccine (1) Never done   Influenza Vaccine  02/22/2025 (Originally 06/25/2024)   Medicare Annual Wellness (AWV)  10/05/2025   Colonoscopy  07/03/2028   DTaP/Tdap/Td (3 - Td or Tdap) 05/24/2029   Pneumococcal Vaccine: 50+ Years  Completed   Hepatitis C Screening  Completed   Zoster Vaccines- Shingrix   Completed   Meningococcal B Vaccine  Aged Out        Assessment/Plan:  This is a routine wellness examination for Rodney Pineda.  Patient Care Team: Jodie Lavern CROME, MD as PCP - General (Family Medicine) Kristie Lamprey, MD as Consulting Physician (Gastroenterology)  I have personally reviewed and noted the following in the patient's chart:   Medical and social history Use of alcohol, tobacco or illicit drugs  Current medications and supplements including opioid prescriptions. Functional ability and status Nutritional status Physical activity Advanced directives List of other physicians Hospitalizations, surgeries, and ER visits in previous 12  months Vitals Screenings to include cognitive, depression, and falls Referrals and appointments  No orders of the defined types were placed in this encounter.  In addition, I have reviewed and discussed with patient certain preventive protocols, quality metrics, and best practice recommendations. A written personalized care plan for preventive services as well as general preventive health recommendations were provided to patient.   Rodney VEAR Haws, LPN   [id number]/[id number]   Return in 1 year (on 10/05/2025).  After Visit Summary: (MyChart) Due to this being a telephonic visit, the after visit summary with patients personalized plan was offered to patient via MyChart   Nurse Notes: nothing significant at this time

## 2024-10-05 NOTE — Patient Instructions (Signed)
 [name],  Thank you for taking the time for your Medicare Wellness Visit. I appreciate your continued commitment to your health goals. Please review the care plan we discussed, and feel free to reach out if I can assist you further.  Please note that Annual Wellness Visits do not include a physical exam. Some assessments may be limited, especially if the visit was conducted virtually. If needed, we may recommend an in-person follow-up with your provider.  Ongoing Care Seeing your primary care provider every 3 to 6 months helps us  monitor your health and provide consistent, personalized care.   Referrals If a referral was made during today's visit and you haven't received any updates within two weeks, please contact the referred provider directly to check on the status.  Recommended Screenings:  Health Maintenance  Topic Date Due   COVID-19 Vaccine (1) Never done   Medicare Annual Wellness Visit  09/14/2024   Flu Shot  02/22/2025*   Colon Cancer Screening  07/03/2028   DTaP/Tdap/Td vaccine (3 - Td or Tdap) 05/24/2029   Pneumococcal Vaccine for age over 13  Completed   Hepatitis C Screening  Completed   Zoster (Shingles) Vaccine  Completed   Meningitis B Vaccine  Aged Out  *Topic was postponed. The date shown is not the original due date.       09/15/2023    1:07 PM  Advanced Directives  Does Patient Have a Medical Advance Directive? Yes  Type of Estate Agent of [place];Living will  Copy of Healthcare Power of Attorney in Chart? No - copy requested    Vision: Annual vision screenings are recommended for early detection of glaucoma, cataracts, and diabetic retinopathy. These exams can also reveal signs of chronic conditions such as diabetes and high blood pressure.  Dental: Annual dental screenings help detect early signs of oral cancer, gum disease, and other conditions linked to overall health, including heart disease and diabetes.  Please see the  attached documents for additional preventive care recommendations.

## 2024-10-28 DIAGNOSIS — H43813 Vitreous degeneration, bilateral: Secondary | ICD-10-CM | POA: Diagnosis not present

## 2024-10-28 DIAGNOSIS — H5203 Hypermetropia, bilateral: Secondary | ICD-10-CM | POA: Diagnosis not present

## 2024-10-28 DIAGNOSIS — H524 Presbyopia: Secondary | ICD-10-CM | POA: Diagnosis not present

## 2024-10-28 DIAGNOSIS — H04123 Dry eye syndrome of bilateral lacrimal glands: Secondary | ICD-10-CM | POA: Diagnosis not present

## 2024-10-28 DIAGNOSIS — H2513 Age-related nuclear cataract, bilateral: Secondary | ICD-10-CM | POA: Diagnosis not present

## 2024-10-28 DIAGNOSIS — R7303 Prediabetes: Secondary | ICD-10-CM | POA: Diagnosis not present

## 2024-10-28 DIAGNOSIS — H25013 Cortical age-related cataract, bilateral: Secondary | ICD-10-CM | POA: Diagnosis not present

## 2024-10-28 LAB — OPHTHALMOLOGY REPORT-SCANNED

## 2025-09-16 ENCOUNTER — Encounter: Admitting: Family Medicine

## 2025-10-12 ENCOUNTER — Ambulatory Visit
